# Patient Record
Sex: Female | Born: 1965 | Race: Black or African American | Hispanic: No | Marital: Single | Smoking: Current every day smoker
Health system: Southern US, Community
[De-identification: ages and names within clinical notes are randomized; demographics above are authoritative.]

## PROBLEM LIST (undated history)

## (undated) DIAGNOSIS — R112 Nausea with vomiting, unspecified: Secondary | ICD-10-CM

## (undated) DIAGNOSIS — E669 Obesity, unspecified: Secondary | ICD-10-CM

## (undated) DIAGNOSIS — J40 Bronchitis, not specified as acute or chronic: Secondary | ICD-10-CM

## (undated) DIAGNOSIS — Z9889 Other specified postprocedural states: Secondary | ICD-10-CM

## (undated) DIAGNOSIS — E119 Type 2 diabetes mellitus without complications: Secondary | ICD-10-CM

## (undated) DIAGNOSIS — I10 Essential (primary) hypertension: Secondary | ICD-10-CM

## (undated) DIAGNOSIS — M545 Low back pain, unspecified: Secondary | ICD-10-CM

## (undated) DIAGNOSIS — O2230 Deep phlebothrombosis in pregnancy, unspecified trimester: Secondary | ICD-10-CM

## (undated) DIAGNOSIS — G8929 Other chronic pain: Secondary | ICD-10-CM

## (undated) DIAGNOSIS — D571 Sickle-cell disease without crisis: Secondary | ICD-10-CM

## (undated) DIAGNOSIS — M5126 Other intervertebral disc displacement, lumbar region: Secondary | ICD-10-CM

## (undated) HISTORY — DX: Other intervertebral disc displacement, lumbar region: M51.26

---

## 2005-05-04 DIAGNOSIS — O2230 Deep phlebothrombosis in pregnancy, unspecified trimester: Secondary | ICD-10-CM

## 2005-05-04 HISTORY — DX: Deep phlebothrombosis in pregnancy, unspecified trimester: O22.30

## 2013-01-31 ENCOUNTER — Emergency Department (HOSPITAL_COMMUNITY): Payer: Medicare Other

## 2013-01-31 ENCOUNTER — Emergency Department (HOSPITAL_COMMUNITY)
Admission: EM | Admit: 2013-01-31 | Discharge: 2013-01-31 | Disposition: A | Discharge status: Home / Self Care | Payer: Medicare Other | Attending: Emergency Medicine | Admitting: Emergency Medicine

## 2013-01-31 ENCOUNTER — Encounter (HOSPITAL_COMMUNITY): Payer: Self-pay | Admitting: *Deleted

## 2013-01-31 DIAGNOSIS — Z3202 Encounter for pregnancy test, result negative: Secondary | ICD-10-CM | POA: Insufficient documentation

## 2013-01-31 DIAGNOSIS — E876 Hypokalemia: Secondary | ICD-10-CM | POA: Insufficient documentation

## 2013-01-31 DIAGNOSIS — B9689 Other specified bacterial agents as the cause of diseases classified elsewhere: Secondary | ICD-10-CM | POA: Insufficient documentation

## 2013-01-31 DIAGNOSIS — A599 Trichomoniasis, unspecified: Secondary | ICD-10-CM

## 2013-01-31 DIAGNOSIS — A499 Bacterial infection, unspecified: Secondary | ICD-10-CM | POA: Insufficient documentation

## 2013-01-31 DIAGNOSIS — Z862 Personal history of diseases of the blood and blood-forming organs and certain disorders involving the immune mechanism: Secondary | ICD-10-CM | POA: Insufficient documentation

## 2013-01-31 DIAGNOSIS — D259 Leiomyoma of uterus, unspecified: Secondary | ICD-10-CM

## 2013-01-31 DIAGNOSIS — Z8709 Personal history of other diseases of the respiratory system: Secondary | ICD-10-CM | POA: Insufficient documentation

## 2013-01-31 DIAGNOSIS — Z79899 Other long term (current) drug therapy: Secondary | ICD-10-CM | POA: Insufficient documentation

## 2013-01-31 DIAGNOSIS — N760 Acute vaginitis: Secondary | ICD-10-CM | POA: Insufficient documentation

## 2013-01-31 DIAGNOSIS — Z880 Allergy status to penicillin: Secondary | ICD-10-CM | POA: Insufficient documentation

## 2013-01-31 DIAGNOSIS — F17200 Nicotine dependence, unspecified, uncomplicated: Secondary | ICD-10-CM | POA: Insufficient documentation

## 2013-01-31 DIAGNOSIS — Z794 Long term (current) use of insulin: Secondary | ICD-10-CM | POA: Insufficient documentation

## 2013-01-31 DIAGNOSIS — E119 Type 2 diabetes mellitus without complications: Secondary | ICD-10-CM | POA: Insufficient documentation

## 2013-01-31 HISTORY — DX: Sickle-cell disease without crisis: D57.1

## 2013-01-31 HISTORY — DX: Bronchitis, not specified as acute or chronic: J40

## 2013-01-31 HISTORY — DX: Type 2 diabetes mellitus without complications: E11.9

## 2013-01-31 LAB — CBC WITH DIFFERENTIAL/PLATELET
Basophils Absolute: 0 10*3/uL (ref 0.0–0.1)
Eosinophils Relative: 1 % (ref 0–5)
Hemoglobin: 11.7 g/dL — ABNORMAL LOW (ref 12.0–15.0)
Lymphocytes Relative: 36 % (ref 12–46)
Lymphs Abs: 3.6 10*3/uL (ref 0.7–4.0)
MCV: 82.9 fL (ref 78.0–100.0)
Monocytes Relative: 7 % (ref 3–12)
Neutro Abs: 5.6 10*3/uL (ref 1.7–7.7)
Neutrophils Relative %: 56 % (ref 43–77)
Platelets: 260 10*3/uL (ref 150–400)
RBC: 4.38 MIL/uL (ref 3.87–5.11)
WBC: 10.1 10*3/uL (ref 4.0–10.5)

## 2013-01-31 LAB — BASIC METABOLIC PANEL
CO2: 26 mEq/L (ref 19–32)
Chloride: 104 mEq/L (ref 96–112)
Glucose, Bld: 159 mg/dL — ABNORMAL HIGH (ref 70–99)
Potassium: 3 mEq/L — ABNORMAL LOW (ref 3.5–5.1)
Sodium: 139 mEq/L (ref 135–145)

## 2013-01-31 LAB — URINALYSIS, ROUTINE W REFLEX MICROSCOPIC
Bilirubin Urine: NEGATIVE
Nitrite: NEGATIVE
Specific Gravity, Urine: 1.015 (ref 1.005–1.030)
pH: 6 (ref 5.0–8.0)

## 2013-01-31 LAB — URINE MICROSCOPIC-ADD ON

## 2013-01-31 LAB — WET PREP, GENITAL: Yeast Wet Prep HPF POC: NONE SEEN

## 2013-01-31 LAB — POCT PREGNANCY, URINE: Preg Test, Ur: NEGATIVE

## 2013-01-31 MED ORDER — METRONIDAZOLE 500 MG PO TABS: 500.0000 mg | ORAL_TABLET | Freq: Two times a day (BID) | ORAL | Status: DC

## 2013-01-31 MED ORDER — POTASSIUM CHLORIDE CRYS ER 20 MEQ PO TBCR
40.0000 meq | EXTENDED_RELEASE_TABLET | Freq: Once | ORAL | Status: AC
  Administered 2013-01-31: 40 meq via ORAL
  Filled 2013-01-31: qty 2

## 2013-01-31 MED ORDER — METRONIDAZOLE 500 MG PO TABS
2000.0000 mg | ORAL_TABLET | Freq: Once | ORAL | Status: AC
  Administered 2013-01-31: 2000 mg via ORAL
  Filled 2013-01-31: qty 4

## 2013-01-31 NOTE — ED Provider Notes (Signed)
CSN: [id number]     Arrival date & time 01/31/13  0408 History   First MD Initiated Contact with Patient 01/31/13 [phone]     Chief Complaint  Patient presents with  . Vaginal Bleeding   (Consider location/radiation/quality/duration/timing/severity/associated sxs/prior Treatment) HPI Comments: Patient is a 47 year old female who presents with a 1 month history of vaginal bleeding. Symptoms started gradually and progressively worsened since the onset. Patient reports a moderate amount of bleeding and is unsure how many tampons/pads she uses per day. Patient reports getting her period "like clockwork" monthly but this time she never stopped bleeding. Patient reports associated 2 week history of lower abdominal cramping. No aggravating/alleviating factors. No other associated symptoms.   Patient is a 47 y.o. female presenting with vaginal bleeding.  Vaginal Bleeding Associated symptoms: abdominal pain     Past Medical History  Diagnosis Date  . Diabetes mellitus without complication   . Sickle cell anemia   . Bronchitis    Past Surgical History  Procedure Laterality Date  . Cesarean section     History reviewed. No pertinent family history. History  Substance Use Topics  . Smoking status: Current Every Day Smoker  . Smokeless tobacco: Not on file  . Alcohol Use: No   OB History   Grav Para Term Preterm Abortions TAB SAB Ect Mult Living                 Review of Systems  Gastrointestinal: Positive for abdominal pain.  Genitourinary: Positive for vaginal bleeding.  All other systems reviewed and are negative.    Allergies  Penicillins  Home Medications   Current Outpatient Rx  Name  Route  Sig  Dispense  Refill  . insulin aspart (NOVOLOG) 100 UNIT/ML injection   Subcutaneous   Inject 2-6 Units into the skin 3 (three) times daily with meals. Sliding scale         . insulin detemir (LEVEMIR) 100 UNIT/ML injection   Subcutaneous   Inject 30 Units into the skin 2 (two)  times daily.         . Liraglutide (VICTOZA) 18 MG/3ML SOPN   Subcutaneous   Inject 18 mg into the skin every evening.          BP 136/72  Pulse 61  Temp(Src) 98 F (36.7 C) (Oral)  Resp 18  SpO2 98% Physical Exam  Nursing note and vitals reviewed. Constitutional: She is oriented to person, place, and time. She appears well-developed and well-nourished. No distress.  HENT:  Head: Normocephalic and atraumatic.  Eyes: Conjunctivae and EOM are normal.  Neck: Normal range of motion.  Cardiovascular: Normal rate and regular rhythm.  Exam reveals no gallop and no friction rub.   No murmur heard. Pulmonary/Chest: Effort normal and breath sounds normal. She has no wheezes. She has no rales. She exhibits no tenderness.  Abdominal: Soft. She exhibits no distension. There is tenderness. There is no rebound and no guarding.  Obese abdomen. Lower abdominal generalized tenderness to palpation. No peritoneal signs or focal tenderness to palpation.   Genitourinary: Vagina normal.  Moderate amount of blood noted in vagina. Cervical os closed. No CMT. No discharge noted. No tenderness to palpation or abnormal masses palpated on bimanual exam.   Musculoskeletal: Normal range of motion.  Neurological: She is alert and oriented to person, place, and time. Coordination normal.  Speech is goal-oriented. Moves limbs without ataxia.   Skin: Skin is warm and dry.  Psychiatric: She has a normal mood  and affect. Her behavior is normal.    ED Course  Procedures (including critical care time) Labs Review Labs Reviewed  WET PREP, GENITAL - Abnormal; Notable for the following:    Trich, Wet Prep FEW (*)    Clue Cells Wet Prep HPF POC MODERATE (*)    WBC, Wet Prep HPF POC FEW (*)    All other components within normal limits  URINALYSIS, ROUTINE W REFLEX MICROSCOPIC - Abnormal; Notable for the following:    APPearance CLOUDY (*)    Hgb urine dipstick TRACE (*)    All other components within normal  limits  CBC WITH DIFFERENTIAL - Abnormal; Notable for the following:    Hemoglobin 11.7 (*)    All other components within normal limits  BASIC METABOLIC PANEL - Abnormal; Notable for the following:    Potassium 3.0 (*)    Glucose, Bld 159 (*)    All other components within normal limits  URINE MICROSCOPIC-ADD ON - Abnormal; Notable for the following:    Squamous Epithelial / LPF FEW (*)    All other components within normal limits  GC/CHLAMYDIA PROBE AMP  POCT PREGNANCY, URINE   Imaging Review US Transvaginal Non-ob  01/31/2013   CLINICAL DATA:  Left lower quadrant pain  EXAM: TRANSABDOMINAL AND TRANSVAGINAL ULTRASOUND OF PELVIS  TECHNIQUE: Both transabdominal and transvaginal ultrasound examinations of the pelvis were performed. Transabdominal technique was performed for global imaging of the pelvis including uterus, ovaries, adnexal regions, and pelvic cul-de-sac. It was necessary to proceed with endovaginal exam following the transabdominal exam to visualize the ovaries.  COMPARISON:  None  FINDINGS: Uterus  Measurements: 8.2 x 4.3 x 5.0 cm. 1.6 x 1.4 cm partially calcified fibroid in the anterior urine body.  Endometrium  Thickness: 8.7 mm  No focal abnormality visualized.  Right ovary  Measurements: 3.1 x 1.9 x 1.8 cm. Normal appearance/no adnexal mass.  Left ovary  Measurements: 2.9 x 1.6 x 2.7 cm. Normal appearance/no adnexal mass.  Other findings  No free fluid.  IMPRESSION: Partially calcified uterine fibroid. Otherwise negative   Electronically Signed   By: Marlan Palau M.D.   On: 01/31/2013 09:03   US Pelvis Complete  01/31/2013   CLINICAL DATA:  Left lower quadrant pain  EXAM: TRANSABDOMINAL AND TRANSVAGINAL ULTRASOUND OF PELVIS  TECHNIQUE: Both transabdominal and transvaginal ultrasound examinations of the pelvis were performed. Transabdominal technique was performed for global imaging of the pelvis including uterus, ovaries, adnexal regions, and pelvic cul-de-sac. It was necessary  to proceed with endovaginal exam following the transabdominal exam to visualize the ovaries.  COMPARISON:  None  FINDINGS: Uterus  Measurements: 8.2 x 4.3 x 5.0 cm. 1.6 x 1.4 cm partially calcified fibroid in the anterior urine body.  Endometrium  Thickness: 8.7 mm  No focal abnormality visualized.  Right ovary  Measurements: 3.1 x 1.9 x 1.8 cm. Normal appearance/no adnexal mass.  Left ovary  Measurements: 2.9 x 1.6 x 2.7 cm. Normal appearance/no adnexal mass.  Other findings  No free fluid.  IMPRESSION: Partially calcified uterine fibroid. Otherwise negative   Electronically Signed   By: Marlan Palau M.D.   On: 01/31/2013 09:03    MDM   1. Uterine fibroid   2. BV (bacterial vaginosis)   3. Trichomonas   4. Hypokalemia     7:57 AM Patient's labs unremarkable for acute changes. Wet prep shows trichomonas and clue cells. Patient will have pelvic US here. Vitals stable and patient afebrile.   9:43 AM Patient will  be treated here for trichomonas. Patient will be discharged with Flagyl for BV. Patient will be referred to [facility] for uterine fibroids. Vitals stable and patient afebrile. Patient given PO potassium for repletion due to potassium of 3.0.     Emilia Beck, [place] 01/31/13 [phone]

## 2013-01-31 NOTE — ED Provider Notes (Signed)
Medical screening examination/treatment/procedure(s) were performed by non-physician practitioner and as supervising physician I was immediately available for consultation/collaboration.  Doug Sou, MD 01/31/13 [phone]

## 2013-01-31 NOTE — ED Notes (Signed)
Pt states vaginal bleeding for three days at a time. Pt state that the pain is severe each time the bleeding starts. Pt states bright red blood and dark blood.

## 2013-02-01 LAB — GC/CHLAMYDIA PROBE AMP
CT Probe RNA: NEGATIVE
GC Probe RNA: NEGATIVE

## 2013-02-15 ENCOUNTER — Emergency Department (HOSPITAL_COMMUNITY)
Admission: EM | Admit: 2013-02-15 | Discharge: 2013-02-15 | Disposition: A | Discharge status: Home / Self Care | Payer: Medicare Other | Attending: Emergency Medicine | Admitting: Emergency Medicine

## 2013-02-15 ENCOUNTER — Encounter (HOSPITAL_COMMUNITY): Payer: Self-pay | Admitting: Emergency Medicine

## 2013-02-15 DIAGNOSIS — Z8709 Personal history of other diseases of the respiratory system: Secondary | ICD-10-CM | POA: Insufficient documentation

## 2013-02-15 DIAGNOSIS — Z880 Allergy status to penicillin: Secondary | ICD-10-CM | POA: Insufficient documentation

## 2013-02-15 DIAGNOSIS — F17200 Nicotine dependence, unspecified, uncomplicated: Secondary | ICD-10-CM | POA: Insufficient documentation

## 2013-02-15 DIAGNOSIS — Z794 Long term (current) use of insulin: Secondary | ICD-10-CM | POA: Insufficient documentation

## 2013-02-15 DIAGNOSIS — E119 Type 2 diabetes mellitus without complications: Secondary | ICD-10-CM | POA: Insufficient documentation

## 2013-02-15 DIAGNOSIS — Z862 Personal history of diseases of the blood and blood-forming organs and certain disorders involving the immune mechanism: Secondary | ICD-10-CM | POA: Insufficient documentation

## 2013-02-15 DIAGNOSIS — Z792 Long term (current) use of antibiotics: Secondary | ICD-10-CM | POA: Insufficient documentation

## 2013-02-15 DIAGNOSIS — Z79899 Other long term (current) drug therapy: Secondary | ICD-10-CM | POA: Insufficient documentation

## 2013-02-15 DIAGNOSIS — T7840XA Allergy, unspecified, initial encounter: Secondary | ICD-10-CM

## 2013-02-15 DIAGNOSIS — R21 Rash and other nonspecific skin eruption: Secondary | ICD-10-CM | POA: Insufficient documentation

## 2013-02-15 MED ORDER — EPINEPHRINE 0.3 MG/0.3ML IJ SOAJ: 0.3000 mg | INTRAMUSCULAR | Status: AC | PRN

## 2013-02-15 MED ORDER — FAMOTIDINE 20 MG PO TABS
20.0000 mg | ORAL_TABLET | Freq: Once | ORAL | Status: AC
  Administered 2013-02-15: 20 mg via ORAL
  Filled 2013-02-15: qty 1

## 2013-02-15 MED ORDER — DIPHENHYDRAMINE HCL 25 MG PO CAPS: 25.0000 mg | ORAL_CAPSULE | Freq: Four times a day (QID) | ORAL | Status: AC | PRN

## 2013-02-15 MED ORDER — METHYLPREDNISOLONE SODIUM SUCC 125 MG IJ SOLR
125.0000 mg | Freq: Once | INTRAMUSCULAR | Status: AC
  Administered 2013-02-15: 125 mg via INTRAVENOUS
  Filled 2013-02-15: qty 2

## 2013-02-15 MED ORDER — DIPHENHYDRAMINE HCL 50 MG/ML IJ SOLN
25.0000 mg | Freq: Once | INTRAMUSCULAR | Status: AC
  Administered 2013-02-15: 25 mg via INTRAVENOUS
  Filled 2013-02-15: qty 1

## 2013-02-15 MED ORDER — PREDNISONE 20 MG PO TABS: 60.0000 mg | ORAL_TABLET | Freq: Every day | ORAL | Status: DC

## 2013-02-15 MED ORDER — FAMOTIDINE 20 MG PO TABS: 20.0000 mg | ORAL_TABLET | Freq: Two times a day (BID) | ORAL | Status: DC

## 2013-02-15 NOTE — ED Provider Notes (Signed)
CSN: [id number]     Arrival date & time 02/15/13  0356 History   First MD Initiated Contact with Patient 02/15/13 [phone]     Chief Complaint  Patient presents with  . Allergic Reaction   (Consider location/radiation/quality/duration/timing/severity/associated sxs/prior Treatment) HPI History provided by patient. Itchy rash starting yesterday morning and has been ongoing throughout the day. Her throat feels scratchy she denies any wheezing or difficulty breathing no tongue or lip or throat swelling otherwise. She has migrating rash that comes and goes "all over".  No new medications. No known new food exposures. No new soaps or detergents. No known precipitating factors. No history of allergic reaction the past. Symptoms moderate in severity. She does have a penicillin allergy but denies any recent antibiotics. She has no known food allergies.  Past Medical History  Diagnosis Date  . Diabetes mellitus without complication   . Sickle cell anemia   . Bronchitis    Past Surgical History  Procedure Laterality Date  . Cesarean section     No family history on file. History  Substance Use Topics  . Smoking status: Current Every Day Smoker  . Smokeless tobacco: Not on file  . Alcohol Use: No   OB History   Grav Para Term Preterm Abortions TAB SAB Ect Mult Living                 Review of Systems  Constitutional: Negative for fever and chills.  Eyes: Negative for visual disturbance.  Respiratory: Negative for wheezing and stridor.   Cardiovascular: Negative for chest pain.  Gastrointestinal: Negative for vomiting and abdominal pain.  Genitourinary: Negative for flank pain.  Musculoskeletal: Negative for back pain, neck pain and neck stiffness.  Skin: Positive for rash.  Neurological: Negative for headaches.  All other systems reviewed and are negative.    Allergies  Penicillins and Bee venom  Home Medications   Current Outpatient Rx  Name  Route  Sig  Dispense  Refill  .  insulin aspart (NOVOLOG) 100 UNIT/ML injection   Subcutaneous   Inject 2-6 Units into the skin 3 (three) times daily with meals. Sliding scale         . insulin detemir (LEVEMIR) 100 UNIT/ML injection   Subcutaneous   Inject 30 Units into the skin 2 (two) times daily.         . Liraglutide (VICTOZA) 18 MG/3ML SOPN   Subcutaneous   Inject 18 mg into the skin every evening.         . metroNIDAZOLE (FLAGYL) 500 MG tablet   Oral   Take 1 tablet (500 mg total) by mouth 2 (two) times daily.   14 tablet   0    BP 144/78  Pulse 89  Temp(Src) 98 F (36.7 C) (Oral)  Resp 20  SpO2 98% Physical Exam  Nursing note and vitals reviewed. Constitutional: She is oriented to person, place, and time. She appears well-developed and well-nourished.  HENT:  Head: Normocephalic and atraumatic.  Mouth/Throat: Oropharynx is clear and moist. No oropharyngeal exudate.  No intraoral involvement. Uvula midline. No tongue or lip swelling.  Eyes: EOM are normal. Pupils are equal, round, and reactive to light.  Neck: Neck supple. No thyromegaly present.  Cardiovascular: Normal rate, normal heart sounds and intact distal pulses.   Pulmonary/Chest: Effort normal and breath sounds normal. No stridor. No respiratory distress. She exhibits no tenderness.  Abdominal: Soft. There is no tenderness.  Musculoskeletal: Normal range of motion. She exhibits no edema.  Lymphadenopathy:    She has no cervical adenopathy.  Neurological: She is alert and oriented to person, place, and time.  Skin: Skin is warm and dry.  Diffuse erythematous urticarial blanching rash consistent with hives    ED Course  Procedures (including critical care time) Labs Review Labs Reviewed - No data to display Imaging Review No results found.  EKG Interpretation   None      IV Benadryl, Solu-Medrol. Pepcid Provided  6:14 AM symptomatically improved and requesting to be discharged home.  Itching resolved. Lungs clear  without stridor. Plan discharge home with prescriptions for prednisone, Benadryl, Pepcid. Outpatient referrals provided. Return precautions verbalized as understood MDM  Diagnosis: Allergic reaction.  Improved with medications Vital signs and nurses notes reviewed   Sunnie Nielsen, MD 02/15/13 [phone]

## 2013-02-15 NOTE — ED Notes (Addendum)
MD updated on pt's condition.

## 2013-02-15 NOTE — ED Notes (Addendum)
PT states she first noticed her hive yesterday morning, almost 24 hours ago. Pt states she has hives all over her body that have come and went throughout the 24 hours, pt states the hives are very itchy and painful. Pt states she is SOB. Pt states she feels like she has hives in throat. Pt states she has an allergy to penicillin and bee stings. No facial swelling or lip edema noted at this time. Pt is A&O X4. Pt states she has taken benadryl around the clock the past 24 hours, pt states the hives have gone away briefly but have come back worse every time.

## 2013-02-15 NOTE — ED Notes (Signed)
MD at bedside. 

## 2013-03-13 ENCOUNTER — Encounter: Payer: Self-pay | Admitting: Obstetrics & Gynecology

## 2013-03-13 ENCOUNTER — Other Ambulatory Visit (HOSPITAL_COMMUNITY)
Admission: RE | Admit: 2013-03-13 | Discharge: 2013-03-13 | Disposition: A | Discharge status: Home / Self Care | Payer: Medicare Other | Source: Ambulatory Visit | Attending: Obstetrics & Gynecology | Admitting: Obstetrics & Gynecology

## 2013-03-13 ENCOUNTER — Encounter: Payer: Self-pay | Admitting: *Deleted

## 2013-03-13 ENCOUNTER — Ambulatory Visit (INDEPENDENT_AMBULATORY_CARE_PROVIDER_SITE_OTHER): Payer: Medicare Other | Admitting: Obstetrics & Gynecology

## 2013-03-13 VITALS — BP 129/72 | HR 85 | Temp 98.9°F | Ht 69.0 in | Wt 295.5 lb

## 2013-03-13 DIAGNOSIS — N920 Excessive and frequent menstruation with regular cycle: Secondary | ICD-10-CM | POA: Insufficient documentation

## 2013-03-13 DIAGNOSIS — D259 Leiomyoma of uterus, unspecified: Secondary | ICD-10-CM

## 2013-03-13 DIAGNOSIS — Z01812 Encounter for preprocedural laboratory examination: Secondary | ICD-10-CM

## 2013-03-13 LAB — POCT PREGNANCY, URINE: Preg Test, Ur: NEGATIVE

## 2013-03-13 NOTE — Progress Notes (Signed)
Patient ID: Anita Morgan, female   DOB: May 13, 1965, 47 y.o.   MRN: [id number]  Chief Complaint  Patient presents with  . Fibroids  . Menorrhagia    HPI Anita Morgan is a 47 y.o. female.  [id number] Patient's last menstrual period was 03/06/2013. Regular menses  Until early September when irregular sometime heavy bleeding started with cramps. Was tx for BV and trichomomas.Marland Kitchen HPI  Past Medical History  Diagnosis Date  . Diabetes mellitus without complication   . Sickle cell anemia   . Bronchitis   . Ruptured lumbar disc     Past Surgical History  Procedure Laterality Date  . Cesarean section    X4  Family History  Problem Relation Age of Onset  . Diabetes Mother     Social History History  Substance Use Topics  . Smoking status: Current Every Day Smoker -- 0.75 packs/day    Types: Cigarettes  . Smokeless tobacco: Never Used  . Alcohol Use: No    Allergies  Allergen Reactions  . Penicillins Anaphylaxis  . Bee Venom     Current Outpatient Prescriptions  Medication Sig Dispense Refill  . diphenhydrAMINE (BENADRYL) 25 mg capsule Take 1 capsule (25 mg total) by mouth every 6 (six) hours as needed for itching.  30 capsule  0  . Liraglutide (VICTOZA) 18 MG/3ML SOPN Inject 18 mg into the skin every evening.      Marland Kitchen EPINEPHrine (EPIPEN) 0.3 mg/0.3 mL SOAJ injection Inject 0.3 mLs (0.3 mg total) into the muscle as needed.  1 Device  1  . insulin aspart (NOVOLOG) 100 UNIT/ML injection Inject 2-6 Units into the skin 3 (three) times daily with meals. Sliding scale      . insulin detemir (LEVEMIR) 100 UNIT/ML injection Inject 30 Units into the skin 2 (two) times daily.       No current facility-administered medications for this visit.    Review of Systems Review of Systems  Constitutional: Negative for fever.  Genitourinary: Positive for menstrual problem and pelvic pain. Negative for vaginal bleeding and vaginal pain.    Blood pressure 129/72, pulse 85, temperature 98.9 F (37.2  C), height 5\' 9"  (1.753 m), weight 295 lb 8 oz (134.038 kg), last menstrual period 03/06/2013.  Physical Exam Physical Exam  Constitutional: She is oriented to person, place, and time. She appears well-developed. No distress.  Abdominal: Soft. She exhibits no mass.  Genitourinary: Vagina normal and uterus normal. No vaginal discharge found.  No mass. States pap in [place] 1 year ago normal  Neurological: She is alert and oriented to person, place, and time.  Skin: Skin is warm and dry.  Psychiatric: She has a normal mood and affect. Her behavior is normal.    Data Reviewed ED note and labs CBC    Component Value Date/Time   WBC 10.1 01/31/2013 0730   RBC 4.38 01/31/2013 0730   HGB 11.7* 01/31/2013 0730   HCT 36.3 01/31/2013 0730   PLT 260 01/31/2013 0730   MCV 82.9 01/31/2013 0730   MCH 26.7 01/31/2013 0730   MCHC 32.2 01/31/2013 0730   RDW 14.1 01/31/2013 0730   LYMPHSABS 3.6 01/31/2013 0730   MONOABS 0.7 01/31/2013 0730   EOSABS 0.1 01/31/2013 0730   BASOSABS 0.0 01/31/2013 0730    CLINICAL DATA: Left lower quadrant pain  EXAM:  TRANSABDOMINAL AND TRANSVAGINAL ULTRASOUND OF PELVIS  TECHNIQUE:  Both transabdominal and transvaginal ultrasound examinations of the  pelvis were performed. Transabdominal technique was performed for  global imaging of  the pelvis including uterus, ovaries, adnexal  regions, and pelvic cul-de-sac. It was necessary to proceed with  endovaginal exam following the transabdominal exam to visualize the  ovaries.  COMPARISON: None  FINDINGS:  Uterus  Measurements: 8.2 x 4.3 x 5.0 cm. 1.6 x 1.4 cm partially calcified  fibroid in the anterior urine body.  Endometrium  Thickness: 8.7 mm No focal abnormality visualized.  Right ovary  Measurements: 3.1 x 1.9 x 1.8 cm. Normal appearance/no adnexal mass.  Left ovary  Measurements: 2.9 x 1.6 x 2.7 cm. Normal appearance/no adnexal mass.  Other findings  No free fluid.  IMPRESSION:  Partially calcified uterine  fibroid. Otherwise negative  Patient given informed consent, signed copy in the chart, time out was performed. Appropriate time out taken. . The patient was placed in the lithotomy position and the cervix brought into view with sterile speculum.  Portio of cervix cleansed x 2 with betadine swabs.  A tenaculum was placed in the anterior lip of the cervix.  The uterus was sounded for depth of 10 cm. A pipelle was introduced to into the uterus, suction created,  and an endometrial sample was obtained. All equipment was removed and accounted for.  The patient tolerated the procedure well.    Patient given post procedure instructions. The patient will return in 2 weeks for results.  Assessment    menometrorrhagia     Plan    RTC for Bx result. Candidate for Mirena        Toma Arts 03/13/2013, 3:46 PM

## 2013-03-13 NOTE — Progress Notes (Signed)
[facility] was referred here today for fibroids and irregular periods, states has 2 periods a month and heavy.

## 2013-03-13 NOTE — Patient Instructions (Signed)
Levonorgestrel intrauterine device (IUD) What is this medicine? LEVONORGESTREL IUD (LEE voe nor jes trel) is a contraceptive (birth control) device. The device is placed inside the uterus by a healthcare professional. It is used to prevent pregnancy and can also be used to treat heavy bleeding that occurs during your period. Depending on the device, it can be used for 3 to 5 years. This medicine may be used for other purposes; ask your health care provider or pharmacist if you have questions. COMMON BRAND NAME(S): Mirena, Skyla What should I tell my health care provider before I take this medicine? They need to know if you have any of these conditions: -abnormal Pap smear -cancer of the breast, uterus, or cervix -diabetes -endometritis -genital or pelvic infection now or in the past -have more than one sexual partner or your partner has more than one partner -heart disease -history of an ectopic or tubal pregnancy -immune system problems -IUD in place -liver disease or tumor -problems with blood clots or take blood-thinners -use intravenous drugs -uterus of unusual shape -vaginal bleeding that has not been explained -an unusual or allergic reaction to levonorgestrel, other hormones, silicone, or polyethylene, medicines, foods, dyes, or preservatives -pregnant or trying to get pregnant -breast-feeding How should I use this medicine? This device is placed inside the uterus by a health care professional. Talk to your pediatrician regarding the use of this medicine in children. Special care may be needed. Overdosage: If you think you have taken too much of this medicine contact a poison control center or emergency room at once. NOTE: This medicine is only for you. Do not share this medicine with others. What if I miss a dose? This does not apply. What may interact with this medicine? Do not take this medicine with any of the following  medications: -amprenavir -bosentan -fosamprenavir This medicine may also interact with the following medications: -aprepitant -barbiturate medicines for inducing sleep or treating seizures -bexarotene -griseofulvin -medicines to treat seizures like carbamazepine, ethotoin, felbamate, oxcarbazepine, phenytoin, topiramate -modafinil -pioglitazone -rifabutin -rifampin -rifapentine -some medicines to treat HIV infection like atazanavir, indinavir, lopinavir, nelfinavir, tipranavir, ritonavir -St. John's wort -warfarin This list may not describe all possible interactions. Give your health care provider a list of all the medicines, herbs, non-prescription drugs, or dietary supplements you use. Also tell them if you smoke, drink alcohol, or use illegal drugs. Some items may interact with your medicine. What should I watch for while using this medicine? Visit your doctor or health care professional for regular check ups. See your doctor if you or your partner has sexual contact with others, becomes HIV positive, or gets a sexual transmitted disease. This product does not protect you against HIV infection (AIDS) or other sexually transmitted diseases. You can check the placement of the IUD yourself by reaching up to the top of your vagina with clean fingers to feel the threads. Do not pull on the threads. It is a good habit to check placement after each menstrual period. Call your doctor right away if you feel more of the IUD than just the threads or if you cannot feel the threads at all. The IUD may come out by itself. You may become pregnant if the device comes out. If you notice that the IUD has come out use a backup birth control method like condoms and call your health care provider. Using tampons will not change the position of the IUD and are okay to use during your period. What side effects may I   notice from receiving this medicine? Side effects that you should report to your doctor or  health care professional as soon as possible: -allergic reactions like skin rash, itching or hives, swelling of the face, lips, or tongue -fever, flu-like symptoms -genital sores -high blood pressure -no menstrual period for 6 weeks during use -pain, swelling, warmth in the leg -pelvic pain or tenderness -severe or sudden headache -signs of pregnancy -stomach cramping -sudden shortness of breath -trouble with balance, talking, or walking -unusual vaginal bleeding, discharge -yellowing of the eyes or skin Side effects that usually do not require medical attention (report to your doctor or health care professional if they continue or are bothersome): -acne -breast pain -change in sex drive or performance -changes in weight -cramping, dizziness, or faintness while the device is being inserted -headache -irregular menstrual bleeding within first 3 to 6 months of use -nausea This list may not describe all possible side effects. Call your doctor for medical advice about side effects. You may report side effects to FDA at 1-800-FDA-1088. Where should I keep my medicine? This does not apply. NOTE: This sheet is a summary. It may not cover all possible information. If you have questions about this medicine, talk to your doctor, pharmacist, or health care provider.  2014, [employer]. (2011-05-21 13:54:04) Dysfunctional Uterine Bleeding Normally, menstrual periods begin between ages 42 to 41 in young women. A normal menstrual cycle/period may begin every 23 days up to 35 days and lasts from 1 to 7 days. Around 12 to 14 days before your menstrual period starts, ovulation (ovary produces an egg) occurs. When counting the time between menstrual periods, count from the first day of bleeding of the previous period to the first day of bleeding of the next period. Dysfunctional (abnormal) uterine bleeding is bleeding that is different from a normal menstrual period. Your periods may come  earlier or later than usual. They may be lighter, have blood clots or be heavier. You may have bleeding between periods, or you may skip one period or more. You may have bleeding after sexual intercourse, bleeding after menopause, or no menstrual period. CAUSES   Pregnancy (normal, miscarriage, tubal).  IUDs (intrauterine device, birth control).  Birth control pills.  Hormone treatment.  Menopause.  Infection of the cervix.  Blood clotting problems.  Infection of the inside lining of the uterus.  Endometriosis, inside lining of the uterus growing in the pelvis and other female organs.  Adhesions (scar tissue) inside the uterus.  Obesity or severe weight loss.  Uterine polyps inside the uterus.  Cancer of the vagina, cervix, or uterus.  Ovarian cysts or polycystic ovary syndrome.  Medical problems (diabetes, thyroid disease).  Uterine fibroids (noncancerous tumor).  Problems with your female hormones.  Endometrial hyperplasia, very thick lining and enlarged cells inside of the uterus.  Medicines that interfere with ovulation.  Radiation to the pelvis or abdomen.  Chemotherapy. DIAGNOSIS   Your doctor will discuss the history of your menstrual periods, medicines you are taking, changes in your weight, stress in your life, and any medical problems you may have.  Your doctor will do a physical and pelvic examination.  Your doctor may want to perform certain tests to make a diagnosis, such as:  Pap test.  Blood tests.  Cultures for infection.  CT scan.  Ultrasound.  Hysteroscopy.  Laparoscopy.  MRI.  Hysterosalpingography.  D and C.  Endometrial biopsy. TREATMENT  Treatment will depend on the cause of the dysfunctional uterine bleeding (DUB). Treatment  may include:  Observing your menstrual periods for a couple of months.  Prescribing medicines for medical problems, including:  Antibiotics.  Hormones.  Birth control pills.  Removing an  IUD (intrauterine device, birth control).  Surgery:  D and C (scrape and remove tissue from inside the uterus).  Laparoscopy (examine inside the abdomen with a lighted tube).  Uterine ablation (destroy lining of the uterus with electrical current, laser, heat, or freezing).  Hysteroscopy (examine cervix and uterus with a lighted tube).  Hysterectomy (remove the uterus). HOME CARE INSTRUCTIONS   If medicines were prescribed, take exactly as directed. Do not change or switch medicines without consulting your caregiver.  Long term heavy bleeding may result in iron deficiency. Your caregiver may have prescribed iron pills. They help replace the iron that your body lost from heavy bleeding. Take exactly as directed.  Do not take aspirin or medicines that contain aspirin one week before or during your menstrual period. Aspirin may make the bleeding worse.  If you need to change your sanitary pad or tampon more than once every 2 hours, stay in bed with your feet elevated and a cold pack on your lower abdomen. Rest as much as possible, until the bleeding stops or slows down.  Eat well-balanced meals. Eat foods high in iron. Examples are:  Leafy green vegetables.  Whole-grain breads and cereals.  Eggs.  Meat.  Liver.  Do not try to lose weight until the abnormal bleeding has stopped and your blood iron level is back to normal. Do not lift more than ten pounds or do strenuous activities when you are bleeding.  For a couple of months, make note on your calendar, marking the start and ending of your period, and the type of bleeding (light, medium, heavy, spotting, clots or missed periods). This is for your caregiver to better evaluate your problem. SEEK MEDICAL CARE IF:   You develop nausea (feeling sick to your stomach) and vomiting, dizziness, or diarrhea while you are taking your medicine.  You are getting lightheaded or weak.  You have any problems that may be related to the  medicine you are taking.  You develop pain with your DUB.  You want to remove your IUD.  You want to stop or change your birth control pills or hormones.  You have any type of abnormal bleeding mentioned above.  You are over 79 years old and have not had a menstrual period yet.  You are 47 years old and you are still having menstrual periods.  You have any of the symptoms mentioned above.  You develop a rash. SEEK IMMEDIATE MEDICAL CARE IF:   An oral temperature above 102 F (38.9 C) develops.  You develop chills.  You are changing your sanitary pad or tampon more than once an hour.  You develop abdominal pain.  You pass out or faint. Document Released: 04/17/2000 Document Revised: 07/13/2011 Document Reviewed: 03/19/2009 [facility] Patient Information 2014 [place], [place].

## 2013-04-20 ENCOUNTER — Ambulatory Visit: Payer: Self-pay | Admitting: Obstetrics & Gynecology

## 2013-05-15 ENCOUNTER — Emergency Department (HOSPITAL_COMMUNITY): Payer: Medicaid Other

## 2013-05-15 ENCOUNTER — Encounter (HOSPITAL_COMMUNITY): Payer: Self-pay | Admitting: Emergency Medicine

## 2013-05-15 ENCOUNTER — Emergency Department (HOSPITAL_COMMUNITY)
Admission: EM | Admit: 2013-05-15 | Discharge: 2013-05-15 | Disposition: A | Discharge status: Home / Self Care | Payer: Medicaid Other | Attending: Emergency Medicine | Admitting: Emergency Medicine

## 2013-05-15 DIAGNOSIS — Z862 Personal history of diseases of the blood and blood-forming organs and certain disorders involving the immune mechanism: Secondary | ICD-10-CM | POA: Insufficient documentation

## 2013-05-15 DIAGNOSIS — Z794 Long term (current) use of insulin: Secondary | ICD-10-CM | POA: Insufficient documentation

## 2013-05-15 DIAGNOSIS — Z880 Allergy status to penicillin: Secondary | ICD-10-CM | POA: Insufficient documentation

## 2013-05-15 DIAGNOSIS — E119 Type 2 diabetes mellitus without complications: Secondary | ICD-10-CM | POA: Insufficient documentation

## 2013-05-15 DIAGNOSIS — R112 Nausea with vomiting, unspecified: Secondary | ICD-10-CM | POA: Insufficient documentation

## 2013-05-15 DIAGNOSIS — F17200 Nicotine dependence, unspecified, uncomplicated: Secondary | ICD-10-CM | POA: Insufficient documentation

## 2013-05-15 DIAGNOSIS — M545 Low back pain, unspecified: Secondary | ICD-10-CM | POA: Insufficient documentation

## 2013-05-15 DIAGNOSIS — Z79899 Other long term (current) drug therapy: Secondary | ICD-10-CM | POA: Insufficient documentation

## 2013-05-15 DIAGNOSIS — J189 Pneumonia, unspecified organism: Secondary | ICD-10-CM

## 2013-05-15 DIAGNOSIS — J159 Unspecified bacterial pneumonia: Secondary | ICD-10-CM | POA: Insufficient documentation

## 2013-05-15 DIAGNOSIS — G8929 Other chronic pain: Secondary | ICD-10-CM | POA: Insufficient documentation

## 2013-05-15 DIAGNOSIS — M549 Dorsalgia, unspecified: Secondary | ICD-10-CM

## 2013-05-15 MED ORDER — KETOROLAC TROMETHAMINE 60 MG/2ML IM SOLN
60.0000 mg | Freq: Once | INTRAMUSCULAR | Status: AC
  Administered 2013-05-15: 60 mg via INTRAMUSCULAR
  Filled 2013-05-15: qty 2

## 2013-05-15 MED ORDER — OXYCODONE-ACETAMINOPHEN 5-325 MG PO TABS
2.0000 | ORAL_TABLET | Freq: Once | ORAL | Status: AC
  Administered 2013-05-15: 2 via ORAL
  Filled 2013-05-15: qty 2

## 2013-05-15 MED ORDER — ONDANSETRON 4 MG PO TBDP
4.0000 mg | ORAL_TABLET | Freq: Once | ORAL | Status: AC
  Administered 2013-05-15: 4 mg via ORAL
  Filled 2013-05-15: qty 1

## 2013-05-15 MED ORDER — HYDROCODONE-ACETAMINOPHEN 5-325 MG PO TABS: 1.0000 | ORAL_TABLET | ORAL | Status: DC | PRN

## 2013-05-15 MED ORDER — ALBUTEROL SULFATE (2.5 MG/3ML) 0.083% IN NEBU
5.0000 mg | INHALATION_SOLUTION | Freq: Once | RESPIRATORY_TRACT | Status: AC
  Administered 2013-05-15: 5 mg via RESPIRATORY_TRACT
  Filled 2013-05-15: qty 6

## 2013-05-15 MED ORDER — IPRATROPIUM BROMIDE 0.02 % IN SOLN
0.5000 mg | Freq: Once | RESPIRATORY_TRACT | Status: AC
  Administered 2013-05-15: 0.5 mg via RESPIRATORY_TRACT
  Filled 2013-05-15: qty 2.5

## 2013-05-15 MED ORDER — ALBUTEROL SULFATE (2.5 MG/3ML) 0.083% IN NEBU: 2.5000 mg | INHALATION_SOLUTION | RESPIRATORY_TRACT | Status: DC | PRN

## 2013-05-15 MED ORDER — AZITHROMYCIN 250 MG PO TABS: ORAL_TABLET | ORAL | Status: DC

## 2013-05-15 NOTE — ED Notes (Signed)
Pt comfortable with d/c and f/u instructions. Prescriptions x3 

## 2013-05-15 NOTE — Discharge Instructions (Signed)
Take antibiotic to completion. Take Vicodin for severe pain only. No driving or operating heavy machinery while taking vicodin. This medication may cause drowsiness. Followup with your primary care doctor in 5 days.   Pneumonia, Adult Pneumonia is an infection of the lungs.  CAUSES Pneumonia may be caused by bacteria or a virus. Usually, these infections are caused by breathing infectious particles into the lungs (respiratory tract). SYMPTOMS   Cough.  Fever.  Chest pain.  Increased rate of breathing.  Wheezing.  Mucus production. DIAGNOSIS  If you have the common symptoms of pneumonia, your caregiver will typically confirm the diagnosis with a chest X-ray. The X-ray will show an abnormality in the lung (pulmonary infiltrate) if you have pneumonia. Other tests of your blood, urine, or sputum may be done to find the specific cause of your pneumonia. Your caregiver may also do tests (blood gases or pulse oximetry) to see how well your lungs are working. TREATMENT  Some forms of pneumonia may be spread to other people when you cough or sneeze. You may be asked to wear a mask before and during your exam. Pneumonia that is caused by bacteria is treated with antibiotic medicine. Pneumonia that is caused by the influenza virus may be treated with an antiviral medicine. Most other viral infections must run their course. These infections will not respond to antibiotics.  PREVENTION A pneumococcal shot (vaccine) is available to prevent a common bacterial cause of pneumonia. This is usually suggested for:  People over 25 years old.  Patients on chemotherapy.  People with chronic lung problems, such as bronchitis or emphysema.  People with immune system problems. If you are over 65 or have a high risk condition, you may receive the pneumococcal vaccine if you have not received it before. In some countries, a routine influenza vaccine is also recommended. This vaccine can help prevent some  cases of pneumonia.You may be offered the influenza vaccine as part of your care. If you smoke, it is time to quit. You may receive instructions on how to stop smoking. Your caregiver can provide medicines and counseling to help you quit. HOME CARE INSTRUCTIONS   Cough suppressants may be used if you are losing too much rest. However, coughing protects you by clearing your lungs. You should avoid using cough suppressants if you can.  Your caregiver may have prescribed medicine if he or she thinks your pneumonia is caused by a bacteria or influenza. Finish your medicine even if you start to feel better.  Your caregiver may also prescribe an expectorant. This loosens the mucus to be coughed up.  Only take over-the-counter or prescription medicines for pain, discomfort, or fever as directed by your caregiver.  Do not smoke. Smoking is a common cause of bronchitis and can contribute to pneumonia. If you are a smoker and continue to smoke, your cough may last several weeks after your pneumonia has cleared.  A cold steam vaporizer or humidifier in your room or home may help loosen mucus.  Coughing is often worse at night. Sleeping in a semi-upright position in a recliner or using a couple pillows under your head will help with this.  Get rest as you feel it is needed. Your body will usually let you know when you need to rest. SEEK IMMEDIATE MEDICAL CARE IF:   Your illness becomes worse. This is especially true if you are elderly or weakened from any other disease.  You cannot control your cough with suppressants and are losing sleep.  You begin coughing up blood.  You develop pain which is getting worse or is uncontrolled with medicines.  You have a fever.  Any of the symptoms which initially brought you in for treatment are getting worse rather than better.  You develop shortness of breath or chest pain. MAKE SURE YOU:   Understand these instructions.  Will watch your  condition.  Will get help right away if you are not doing well or get worse. Document Released: 04/20/2005 Document Revised: 07/13/2011 Document Reviewed: 07/10/2010 [facility] Patient Information 2014 [place], [place].  Back Pain, Adult Low back pain is very common. About 1 in 5 people have back pain.The cause of low back pain is rarely dangerous. The pain often gets better over time.About half of people with a sudden onset of back pain feel better in just 2 weeks. About 8 in 10 people feel better by 6 weeks.  CAUSES Some common causes of back pain include:  Strain of the muscles or ligaments supporting the spine.  Wear and tear (degeneration) of the spinal discs.  Arthritis.  Direct injury to the back. DIAGNOSIS Most of the time, the direct cause of low back pain is not known.However, back pain can be treated effectively even when the exact cause of the pain is unknown.Answering your caregiver's questions about your overall health and symptoms is one of the most accurate ways to make sure the cause of your pain is not dangerous. If your caregiver needs more information, he or she may order lab work or imaging tests (X-rays or MRIs).However, even if imaging tests show changes in your back, this usually does not require surgery. HOME CARE INSTRUCTIONS For many people, back pain returns.Since low back pain is rarely dangerous, it is often a condition that people can learn to [facility] their own.   Remain active. It is stressful on the back to sit or stand in one place. Do not sit, drive, or stand in one place for more than 30 minutes at a time. Take short walks on level surfaces as soon as pain allows.Try to increase the length of time you walk each day.  Do not stay in bed.Resting more than 1 or 2 days can delay your recovery.  Do not avoid exercise or work.Your body is made to move.It is not dangerous to be active, even though your back may hurt.Your back will likely heal faster  if you return to being active before your pain is gone.  Pay attention to your body when you bend and lift. Many people have less discomfortwhen lifting if they bend their knees, keep the load close to their bodies,and avoid twisting. Often, the most comfortable positions are those that put less stress on your recovering back.  Find a comfortable position to sleep. Use a firm mattress and lie on your side with your knees slightly bent. If you lie on your back, put a pillow under your knees.  Only take over-the-counter or prescription medicines as directed by your caregiver. Over-the-counter medicines to reduce pain and inflammation are often the most helpful.Your caregiver may prescribe muscle relaxant drugs.These medicines help dull your pain so you can more quickly return to your normal activities and healthy exercise.  Put ice on the injured area.  Put ice in a plastic bag.  Place a towel between your skin and the bag.  Leave the ice on for 15-20 minutes, 03-04 times a day for the first 2 to 3 days. After that, ice and heat may be alternated to  reduce pain and spasms.  Ask your caregiver about trying back exercises and gentle massage. This may be of some benefit.  Avoid feeling anxious or stressed.Stress increases muscle tension and can worsen back pain.It is important to recognize when you are anxious or stressed and learn ways to manage it.Exercise is a great option. SEEK MEDICAL CARE IF:  You have pain that is not relieved with rest or medicine.  You have pain that does not improve in 1 week.  You have new symptoms.  You are generally not feeling well. SEEK IMMEDIATE MEDICAL CARE IF:   You have pain that radiates from your back into your legs.  You develop new bowel or bladder control problems.  You have unusual weakness or numbness in your arms or legs.  You develop nausea or vomiting.  You develop abdominal pain.  You feel faint. Document Released: 04/20/2005  Document Revised: 10/20/2011 Document Reviewed: 09/08/2010 [facility] Patient Information 2014 [place], [place].

## 2013-05-15 NOTE — ED Provider Notes (Signed)
CSN: [id number]     Arrival date & time 05/15/13  1036 History   First MD Initiated Contact with Patient 05/15/13 1132    This chart was scribed for Lorenda Peck, a non-physician practitioner working with Jasper Riling. Alvino Chapel, MD by Denice Bors, ED Scribe. This patient was seen in room TR08C/TR08C and the patient's care was started at 11:34 AM     Chief Complaint  Patient presents with  . Asthma  . Back Pain   (Consider location/radiation/quality/duration/timing/severity/associated sxs/prior Treatment) The history is provided by the patient. No language interpreter was used.   HPI Comments: Anita Morgan is a 48 y.o. female who presents to the Emergency Department with PMHx of bronchitis, chronic low back pain, and ruptured disc in low back complaining of constant worsening wheezing onset 5-7 days. Reports associated mild subjective fever, congestion, chest tightness, emesis, back pain, nausea, and productive cough with sputum. Denies any aggravating or alleviating factors. Denies associated chest pain.   Additionally reports chronic low back pain with no exacerbation or precipitating factors. Denies recent injury, recent trauma, urinary or fecal incontinence, urinary retention, perineal/saddle paresthesias, fever, PMHx of cancer, or IV drug use. She used to have fentanyl patches which she stopped using.   Past Medical History  Diagnosis Date  . Diabetes mellitus without complication   . Sickle cell anemia   . Bronchitis   . Ruptured lumbar disc    Past Surgical History  Procedure Laterality Date  . Cesarean section     Family History  Problem Relation Age of Onset  . Diabetes Mother    History  Substance Use Topics  . Smoking status: Current Every Day Smoker -- 0.75 packs/day    Types: Cigarettes  . Smokeless tobacco: Never Used  . Alcohol Use: No   OB History   Grav Para Term Preterm Abortions TAB SAB Ect Mult Living   5 4 3 1 1  1         Review of Systems   Constitutional: Positive for fever.  Respiratory: Positive for cough and shortness of breath.   Gastrointestinal: Positive for nausea and vomiting.  Musculoskeletal: Positive for back pain.  Neurological: Negative for numbness.  Psychiatric/Behavioral: Negative for confusion.   A complete 10 system review of systems was obtained and all systems are negative except as noted in the HPI and PMHx.     Allergies  Penicillins and Bee venom  Home Medications   Current Outpatient Rx  Name  Route  Sig  Dispense  Refill  . diphenhydrAMINE (BENADRYL) 25 mg capsule   Oral   Take 1 capsule (25 mg total) by mouth every 6 (six) hours as needed for itching.   30 capsule   0   . EPINEPHrine (EPIPEN) 0.3 mg/0.3 mL SOAJ injection   Intramuscular   Inject 0.3 mLs (0.3 mg total) into the muscle as needed.   1 Device   1   . insulin aspart (NOVOLOG) 100 UNIT/ML injection   Subcutaneous   Inject 2-6 Units into the skin 3 (three) times daily with meals. Sliding scale         . insulin detemir (LEVEMIR) 100 UNIT/ML injection   Subcutaneous   Inject 30 Units into the skin 2 (two) times daily.         . Liraglutide (VICTOZA) 18 MG/3ML SOPN   Subcutaneous   Inject 18 mg into the skin every evening.          BP 163/91  Pulse 83  Temp(Src) 98.7 F (37.1 C) (Oral)  Resp 20  SpO2 100% Physical Exam  Nursing note and vitals reviewed. Constitutional: She is oriented to person, place, and time. She appears well-developed and well-nourished. No distress.  HENT:  Head: Normocephalic and atraumatic.  Eyes: EOM are normal.  Neck: Neck supple. No tracheal deviation present.  Cardiovascular: Normal rate.   Pulmonary/Chest: Effort normal. No respiratory distress.  End expiratory wheezes (minimal) and inspiratory   Scattered rhonchi cleared with cough    Musculoskeletal: Normal range of motion.  No midline C-spine, T-spine, or L-spine tenderness with no step-offs or deformities noted    TTP of bilateral para-lumbar   Neurological: She is alert and oriented to person, place, and time.  Ambulates with normal gait   Skin: Skin is warm and dry.  Psychiatric: She has a normal mood and affect. Her behavior is normal.    ED Course  Procedures  COORDINATION OF CARE:  Nursing notes reviewed. Vital signs reviewed. Initial pt interview and examination performed.   11:39 AM-Discussed work up plan with pt at bedside, which includes CXR. Pt agrees with plan.   Treatment plan initiated: Medications  ketorolac (TORADOL) injection 60 mg (not administered)  albuterol (PROVENTIL) (2.5 MG/3ML) 0.083% nebulizer solution 5 mg (not administered)  ipratropium (ATROVENT) nebulizer solution 0.5 mg (not administered)     Initial diagnostic testing ordered.    Labs Review Labs Reviewed - No data to display Imaging Review Dg Chest 2 View  05/15/2013   CLINICAL DATA:  Asthma, back pain  EXAM: CHEST  2 VIEW  COMPARISON:  None.  FINDINGS: Borderline cardiomegaly. Diffuse central bronchitic changes and peribronchial cuffing. Ill-defined patchy opacity in the right lung base. No pleural effusion or pneumothorax. No acute osseous abnormality.  IMPRESSION: 1. Ill-defined patchy opacity in the right base on the frontal view may reflect early pneumonia. 2. Diffuse central airway thickening and peribronchial cuffing as can be seen an inflammatory conditions such as asthma, as well as an acute bronchitis and viral respiratory infection.   Electronically Signed   By: Jacqulynn Cadet M.D.   On: 05/15/2013 13:08    EKG Interpretation   None       MDM   1. CAP (community acquired pneumonia)   2. Chronic back pain    Patient presenting with wheezing, productive cough. She is well appearing and in no apparent distress, afebrile. Stable vital signs. Scattered wheezes and rhonchi heard on exam, chest x-ray showing ill-defined patchy opacity in the right base on the frontal view possibly  reflecting an early pneumonia. Given symptoms, will treat for pneumonia with azithromycin. Patient reports improvement of her symptoms after doing that. Repeat lung exam shows mild clinical improvement. No respiratory distress, O2 sat 100% on room air. Regarding low back pain, this is chronic. No red flags concerning patient's back pain. No s/s of central cord compression or cauda equina. Lower extremities are neurovascularly intact and patient is ambulating without difficulty. Advised her to see back specialist or discuss further with her PCP management for her symptoms. She has been unable to get into pain management. She is stable for discharge. Return precautions given. Patient states understanding of treatment care plan and is agreeable.   I personally performed the services described in this documentation, which was scribed in my presence. The recorded information has been reviewed and is accurate.    Illene Labrador, PA-C 05/15/13 1320

## 2013-05-15 NOTE — ED Notes (Signed)
Pt reports she current smoker with asthma has had chest tightness, cough and wheezing for the last week. Used her albtuerol inhaler with little relief. ALso reports chronic back pain for the last week. Pt awake, alert, orientedx4, VSS.

## 2013-05-15 NOTE — ED Provider Notes (Signed)
Medical screening examination/treatment/procedure(s) were performed by non-physician practitioner and as supervising physician I was immediately available for consultation/collaboration.  EKG Interpretation   None        Jasper Riling. Alvino Chapel, MD 05/15/13 [phone]

## 2013-05-18 ENCOUNTER — Emergency Department (INDEPENDENT_AMBULATORY_CARE_PROVIDER_SITE_OTHER)
Admission: EM | Admit: 2013-05-18 | Discharge: 2013-05-18 | Disposition: A | Discharge status: Home / Self Care | Payer: Medicare Other | Source: Home / Self Care | Attending: Family Medicine | Admitting: Family Medicine

## 2013-05-18 ENCOUNTER — Encounter (HOSPITAL_COMMUNITY): Payer: Self-pay | Admitting: Emergency Medicine

## 2013-05-18 DIAGNOSIS — G8929 Other chronic pain: Secondary | ICD-10-CM

## 2013-05-18 DIAGNOSIS — M549 Dorsalgia, unspecified: Secondary | ICD-10-CM

## 2013-05-18 MED ORDER — MELOXICAM 7.5 MG PO TABS: 7.5000 mg | ORAL_TABLET | Freq: Every day | ORAL | Status: DC

## 2013-05-18 NOTE — ED Notes (Signed)
Pt c/o chronic back pain.... Getting over the pneumonia and taking Levofloxacin Reports she has recently moved  From [place] She is alert; ambulated well to exam room w/NAD... No signs of acute distress.

## 2013-05-18 NOTE — ED Provider Notes (Signed)
CSN: [id number]     Arrival date & time 05/18/13  0800 History   First MD Initiated Contact with Patient 05/18/13 0815     Chief Complaint  Patient presents with  . Back Pain   (Consider location/radiation/quality/duration/timing/severity/associated sxs/prior Treatment) HPI Comments: Anita Morgan presents to the [facility] for assistance with her chronic lower back pain. She has struggled with chronic pain since 2007 and reports that she receives SSDI as a result of chronic back pain. States she and her PCP have been working to find either an orthopedic physician or pain management center that will assist her with long term management, but have not yet succeeded in locating provider that will accept her insurance. She is here because she would like [place] to help her establish care with a local orthopedist or pain management clinic. States pain is in her lower back and radiates to her right buttock and right thigh. Reports intermittent parasthesias at right foot. Denies saddle anesthesia or incontinence. No new injury or fever. No abdominal pain or urinary symptoms. She is morbidly obese.   Patient is a 48 y.o. female presenting with back pain. The history is provided by the patient.  Back Pain   Past Medical History  Diagnosis Date  . Diabetes mellitus without complication   . Sickle cell anemia   . Bronchitis   . Ruptured lumbar disc    Past Surgical History  Procedure Laterality Date  . Cesarean section     Family History  Problem Relation Age of Onset  . Diabetes Mother    History  Substance Use Topics  . Smoking status: Current Every Day Smoker -- 0.75 packs/day    Types: Cigarettes  . Smokeless tobacco: Never Used  . Alcohol Use: No   OB History   Grav Para Term Preterm Abortions TAB SAB Ect Mult Living   5 4 3 1 1  1         Review of Systems  Musculoskeletal: Positive for back pain.  All other systems reviewed and are negative.    Allergies  Bee venom and Penicillins  Home  Medications   Current Outpatient Rx  Name  Route  Sig  Dispense  Refill  . HYDROcodone-acetaminophen (NORCO/VICODIN) 5-325 MG per tablet   Oral   Take 1-2 tablets by mouth every 4 (four) hours as needed.   10 tablet   0   . insulin aspart (NOVOLOG) 100 UNIT/ML injection   Subcutaneous   Inject 2-6 Units into the skin 3 (three) times daily with meals. Sliding scale         . metFORMIN (GLUCOPHAGE) 500 MG tablet   Oral   Take 500 mg by mouth 2 (two) times daily with a meal.         . albuterol (PROVENTIL HFA;VENTOLIN HFA) 108 (90 BASE) MCG/ACT inhaler   Inhalation   Inhale 2 puffs into the lungs every 6 (six) hours as needed for wheezing or shortness of breath.         Marland Kitchen albuterol (PROVENTIL) (2.5 MG/3ML) 0.083% nebulizer solution   Nebulization   Take 2.5 mg by nebulization every 6 (six) hours as needed for wheezing or shortness of breath.         Marland Kitchen albuterol (PROVENTIL) (2.5 MG/3ML) 0.083% nebulizer solution   Nebulization   Take 3 mLs (2.5 mg total) by nebulization every 4 (four) hours as needed for wheezing or shortness of breath.   30 vial   0   . azithromycin (ZITHROMAX Z-PAK)  250 MG tablet      2 po day one, then 1 daily x 4 days   6 tablet   0   . diphenhydrAMINE (BENADRYL) 25 mg capsule   Oral   Take 1 capsule (25 mg total) by mouth every 6 (six) hours as needed for itching.   30 capsule   0   . EPINEPHrine (EPIPEN) 0.3 mg/0.3 mL SOAJ injection   Intramuscular   Inject 0.3 mLs (0.3 mg total) into the muscle as needed.   1 Device   1   . insulin detemir (LEVEMIR) 100 UNIT/ML injection   Subcutaneous   Inject 30-45 Units into the skin 2 (two) times daily. 45 units in the morning and 30 units in the evening.         . Liraglutide (VICTOZA) 18 MG/3ML SOPN   Subcutaneous   Inject 18 mg into the skin every evening.         Marland Kitchen oxyCODONE-acetaminophen (PERCOCET) 10-325 MG per tablet   Oral   Take 1 tablet by mouth every 4 (four) hours as needed  for pain.         Marland Kitchen Potassium (POTASSIMIN PO)   Oral   Take 1 tablet by mouth daily.          BP 135/83  Pulse 91  Temp(Src) 98.2 F (36.8 C) (Oral)  Resp 18  SpO2 100%  LMP 05/02/2013 Physical Exam  Nursing note and vitals reviewed. Constitutional: She is oriented to person, place, and time. She appears well-developed and well-nourished.  +appears uncomfortable  HENT:  Head: Normocephalic and atraumatic.  Eyes: Conjunctivae are normal. Right eye exhibits no discharge. Left eye exhibits no discharge. No scleral icterus.  Neck: Normal range of motion. Neck supple.  Cardiovascular: Normal rate, regular rhythm and normal heart sounds.   Pulmonary/Chest: Effort normal and breath sounds normal. No respiratory distress.  Abdominal: Soft. Bowel sounds are normal. She exhibits no distension. There is no tenderness.  Musculoskeletal:       Lumbar back: She exhibits decreased range of motion, tenderness and pain. She exhibits no bony tenderness, no swelling, no edema, no deformity, no laceration, no spasm and normal pulse.       Back:  +point tenderness at right SI joint and +SLR on right  Neurological: She is alert and oriented to person, place, and time.  Skin: Skin is warm and dry. No rash noted.  Psychiatric: She has a normal mood and affect. Her behavior is normal.    ED Course  Procedures (including critical care time) Labs Review Labs Reviewed - No data to display Imaging Review No results found.  EKG Interpretation    Date/Time:    Ventricular Rate:    PR Interval:    QRS Duration:   QT Interval:    QTC Calculation:   R Axis:     Text Interpretation:              MDM  I explained to patient that I sympathize with her condition, I was reassured by her exam and felt she did not have any findings to suggest the need for urgent or emergent intervention. I advised I would be happy to refer her to the orthopedist on call for [facility] for unassigned patients  in hope that she could establish an appointment. Will also provide her with pain management referral. I encouraged her to continue to work  with her PCP to find specialist.     Lahoma Rocker,  PA 05/18/13 [phone]

## 2013-05-18 NOTE — Discharge Instructions (Signed)
On you discharge instructions I have listed both the orthopedist on call for unassigned patient's today as well and the [facility] in hope that you can establish care with one or both of these groups. I have also provided an additional prescription that can be taken on a daily basis to try to help with your pain.

## 2013-05-19 ENCOUNTER — Emergency Department (HOSPITAL_COMMUNITY): Payer: Medicare Other

## 2013-05-19 ENCOUNTER — Inpatient Hospital Stay (HOSPITAL_COMMUNITY)
Admission: EM | Admit: 2013-05-19 | Discharge: 2013-05-21 | DRG: 194 | Disposition: A | Discharge status: Home / Self Care | Payer: Medicare Other | Attending: Internal Medicine | Admitting: Internal Medicine

## 2013-05-19 ENCOUNTER — Inpatient Hospital Stay (HOSPITAL_COMMUNITY): Payer: Medicare Other

## 2013-05-19 ENCOUNTER — Encounter (HOSPITAL_COMMUNITY): Payer: Self-pay | Admitting: Emergency Medicine

## 2013-05-19 DIAGNOSIS — Z79899 Other long term (current) drug therapy: Secondary | ICD-10-CM

## 2013-05-19 DIAGNOSIS — N920 Excessive and frequent menstruation with regular cycle: Secondary | ICD-10-CM

## 2013-05-19 DIAGNOSIS — Z880 Allergy status to penicillin: Secondary | ICD-10-CM

## 2013-05-19 DIAGNOSIS — G8929 Other chronic pain: Secondary | ICD-10-CM | POA: Diagnosis present

## 2013-05-19 DIAGNOSIS — R609 Edema, unspecified: Secondary | ICD-10-CM | POA: Diagnosis present

## 2013-05-19 DIAGNOSIS — E119 Type 2 diabetes mellitus without complications: Secondary | ICD-10-CM | POA: Diagnosis present

## 2013-05-19 DIAGNOSIS — Z6841 Body Mass Index (BMI) 40.0 and over, adult: Secondary | ICD-10-CM

## 2013-05-19 DIAGNOSIS — E876 Hypokalemia: Secondary | ICD-10-CM | POA: Diagnosis present

## 2013-05-19 DIAGNOSIS — M545 Low back pain, unspecified: Secondary | ICD-10-CM | POA: Diagnosis present

## 2013-05-19 DIAGNOSIS — J40 Bronchitis, not specified as acute or chronic: Secondary | ICD-10-CM | POA: Diagnosis present

## 2013-05-19 DIAGNOSIS — D571 Sickle-cell disease without crisis: Secondary | ICD-10-CM | POA: Diagnosis present

## 2013-05-19 DIAGNOSIS — Z833 Family history of diabetes mellitus: Secondary | ICD-10-CM

## 2013-05-19 DIAGNOSIS — M5126 Other intervertebral disc displacement, lumbar region: Secondary | ICD-10-CM | POA: Diagnosis present

## 2013-05-19 DIAGNOSIS — J189 Pneumonia, unspecified organism: Secondary | ICD-10-CM | POA: Diagnosis present

## 2013-05-19 DIAGNOSIS — F17200 Nicotine dependence, unspecified, uncomplicated: Secondary | ICD-10-CM | POA: Diagnosis present

## 2013-05-19 DIAGNOSIS — Z86718 Personal history of other venous thrombosis and embolism: Secondary | ICD-10-CM

## 2013-05-19 DIAGNOSIS — Z794 Long term (current) use of insulin: Secondary | ICD-10-CM

## 2013-05-19 HISTORY — DX: Nausea with vomiting, unspecified: Z98.890

## 2013-05-19 HISTORY — DX: Other specified postprocedural states: R11.2

## 2013-05-19 HISTORY — DX: Other specified postprocedural states: Z98.890

## 2013-05-19 HISTORY — DX: Deep phlebothrombosis in pregnancy, unspecified trimester: O22.30

## 2013-05-19 LAB — URINALYSIS W MICROSCOPIC + REFLEX CULTURE
Bilirubin Urine: NEGATIVE
GLUCOSE, UA: 500 mg/dL — AB
HGB URINE DIPSTICK: NEGATIVE
KETONES UR: NEGATIVE mg/dL
Leukocytes, UA: NEGATIVE
Nitrite: NEGATIVE
PROTEIN: NEGATIVE mg/dL
Specific Gravity, Urine: 1.026 (ref 1.005–1.030)
Urobilinogen, UA: 0.2 mg/dL (ref 0.0–1.0)
pH: 6 (ref 5.0–8.0)

## 2013-05-19 LAB — CREATININE, SERUM
Creatinine, Ser: 0.7 mg/dL (ref 0.50–1.10)
GFR calc Af Amer: >90 mL/min (ref >90)
GFR calc non Af Amer: >90 mL/min (ref >90)

## 2013-05-19 LAB — CBC WITH DIFFERENTIAL/PLATELET
BASOS PCT: 0 % (ref 0–1)
Basophils Absolute: 0 10*3/uL (ref 0.0–0.1)
EOS ABS: 0.1 10*3/uL (ref 0.0–0.7)
Eosinophils Relative: 1 % (ref 0–5)
HCT: 35.1 % — ABNORMAL LOW (ref 36.0–46.0)
Hemoglobin: 11.5 g/dL — ABNORMAL LOW (ref 12.0–15.0)
Lymphocytes Relative: 38 % (ref 12–46)
Lymphs Abs: 3.9 10*3/uL (ref 0.7–4.0)
MCH: 27.6 pg (ref 26.0–34.0)
MCHC: 32.8 g/dL (ref 30.0–36.0)
MCV: 84.4 fL (ref 78.0–100.0)
Monocytes Absolute: 0.6 10*3/uL (ref 0.1–1.0)
Monocytes Relative: 6 % (ref 3–12)
Neutro Abs: 5.7 10*3/uL (ref 1.7–7.7)
Neutrophils Relative %: 55 % (ref 43–77)
PLATELETS: 292 10*3/uL (ref 150–400)
RBC: 4.16 MIL/uL (ref 3.87–5.11)
RDW: 13.6 % (ref 11.5–15.5)
WBC: 10.4 10*3/uL (ref 4.0–10.5)

## 2013-05-19 LAB — COMPREHENSIVE METABOLIC PANEL
ALT: 9 U/L (ref 0–35)
AST: 12 U/L (ref 0–37)
Albumin: 3.2 g/dL — ABNORMAL LOW (ref 3.5–5.2)
Alkaline Phosphatase: 84 U/L (ref 39–117)
BUN: 9 mg/dL (ref 6–23)
CALCIUM: 8.9 mg/dL (ref 8.4–10.5)
CO2: 27 mEq/L (ref 19–32)
Chloride: 101 mEq/L (ref 96–112)
Creatinine, Ser: 0.62 mg/dL (ref 0.50–1.10)
GFR calc Af Amer: >90 mL/min (ref >90)
GFR calc non Af Amer: >90 mL/min (ref >90)
Glucose, Bld: 276 mg/dL — ABNORMAL HIGH (ref 70–99)
Potassium: 3.3 mEq/L — ABNORMAL LOW (ref 3.7–5.3)
SODIUM: 141 meq/L (ref 137–147)
TOTAL PROTEIN: 7 g/dL (ref 6.0–8.3)
Total Bilirubin: 0.4 mg/dL (ref 0.3–1.2)

## 2013-05-19 LAB — LIPASE, BLOOD: LIPASE: 12 U/L (ref 11–59)

## 2013-05-19 LAB — CBC
HCT: 34.5 % — ABNORMAL LOW (ref 36.0–46.0)
Hemoglobin: 10.8 g/dL — ABNORMAL LOW (ref 12.0–15.0)
MCH: 26.9 pg (ref 26.0–34.0)
MCHC: 31.3 g/dL (ref 30.0–36.0)
MCV: 85.8 fL (ref 78.0–100.0)
Platelets: 266 10*3/uL (ref 150–400)
RBC: 4.02 MIL/uL (ref 3.87–5.11)
RDW: 13.8 % (ref 11.5–15.5)
WBC: 10.4 10*3/uL (ref 4.0–10.5)

## 2013-05-19 LAB — RETICULOCYTES
RBC.: 4.16 MIL/uL (ref 3.87–5.11)
RETIC COUNT ABSOLUTE: 87.4 10*3/uL (ref 19.0–186.0)
Retic Ct Pct: 2.1 % (ref 0.4–3.1)

## 2013-05-19 LAB — PREGNANCY, URINE: PREG TEST UR: NEGATIVE

## 2013-05-19 MED ORDER — ONDANSETRON HCL 4 MG/2ML IJ SOLN
4.0000 mg | Freq: Once | INTRAMUSCULAR | Status: AC
  Administered 2013-05-19: 4 mg via INTRAVENOUS
  Filled 2013-05-19: qty 2

## 2013-05-19 MED ORDER — LIRAGLUTIDE 18 MG/3ML ~~LOC~~ SOPN: 18.0000 mg | PEN_INJECTOR | Freq: Every evening | SUBCUTANEOUS | Status: DC

## 2013-05-19 MED ORDER — INSULIN DETEMIR 100 UNIT/ML ~~LOC~~ SOLN
30.0000 [IU] | Freq: Every day | SUBCUTANEOUS | Status: DC
  Administered 2013-05-20: 30 [IU] via SUBCUTANEOUS
  Filled 2013-05-19 (×2): qty 0.3

## 2013-05-19 MED ORDER — GUAIFENESIN ER 600 MG PO TB12
600.0000 mg | ORAL_TABLET | Freq: Two times a day (BID) | ORAL | Status: DC | PRN
  Filled 2013-05-19: qty 1

## 2013-05-19 MED ORDER — DIPHENHYDRAMINE HCL 25 MG PO CAPS: 25.0000 mg | ORAL_CAPSULE | Freq: Four times a day (QID) | ORAL | Status: DC | PRN

## 2013-05-19 MED ORDER — HYDROMORPHONE HCL PF 1 MG/ML IJ SOLN
1.0000 mg | Freq: Once | INTRAMUSCULAR | Status: AC
  Administered 2013-05-19: 1 mg via INTRAVENOUS
  Filled 2013-05-19: qty 1

## 2013-05-19 MED ORDER — ACETAMINOPHEN 325 MG PO TABS: 650.0000 mg | ORAL_TABLET | Freq: Four times a day (QID) | ORAL | Status: DC | PRN

## 2013-05-19 MED ORDER — LIRAGLUTIDE 18 MG/3ML ~~LOC~~ SOPN: 1.8000 mg | PEN_INJECTOR | Freq: Every evening | SUBCUTANEOUS | Status: DC

## 2013-05-19 MED ORDER — HYDROMORPHONE HCL PF 1 MG/ML IJ SOLN
1.0000 mg | Freq: Once | INTRAMUSCULAR | Status: AC
Start: 2013-05-19 — End: 2013-05-19
  Administered 2013-05-19: 1 mg via INTRAVENOUS
  Filled 2013-05-19: qty 1

## 2013-05-19 MED ORDER — POLYETHYLENE GLYCOL 3350 17 G PO PACK
17.0000 g | PACK | Freq: Every day | ORAL | Status: DC | PRN
  Filled 2013-05-19: qty 1

## 2013-05-19 MED ORDER — ONDANSETRON HCL 4 MG PO TABS
4.0000 mg | ORAL_TABLET | Freq: Four times a day (QID) | ORAL | Status: DC | PRN
  Administered 2013-05-20: 4 mg via ORAL
  Filled 2013-05-19: qty 1

## 2013-05-19 MED ORDER — ACETAMINOPHEN 650 MG RE SUPP: 650.0000 mg | Freq: Four times a day (QID) | RECTAL | Status: DC | PRN

## 2013-05-19 MED ORDER — IOHEXOL 350 MG/ML SOLN
100.0000 mL | Freq: Once | INTRAVENOUS | Status: AC | PRN
  Administered 2013-05-19: 100 mL via INTRAVENOUS

## 2013-05-19 MED ORDER — HYDROCODONE-ACETAMINOPHEN 5-325 MG PO TABS
1.0000 | ORAL_TABLET | ORAL | Status: DC | PRN
  Administered 2013-05-20 (×3): 2 via ORAL
  Administered 2013-05-21: 1 via ORAL
  Filled 2013-05-19 (×4): qty 2

## 2013-05-19 MED ORDER — ALBUTEROL SULFATE (2.5 MG/3ML) 0.083% IN NEBU
2.5000 mg | INHALATION_SOLUTION | Freq: Once | RESPIRATORY_TRACT | Status: AC
  Administered 2013-05-19: 2.5 mg via RESPIRATORY_TRACT
  Filled 2013-05-19: qty 3

## 2013-05-19 MED ORDER — DOXYCYCLINE HYCLATE 100 MG IV SOLR
100.0000 mg | Freq: Two times a day (BID) | INTRAVENOUS | Status: DC
  Administered 2013-05-19 – 2013-05-21 (×4): 100 mg via INTRAVENOUS
  Filled 2013-05-19 (×5): qty 100

## 2013-05-19 MED ORDER — DOXYCYCLINE HYCLATE 100 MG PO TABS
100.0000 mg | ORAL_TABLET | Freq: Two times a day (BID) | ORAL | Status: DC
  Filled 2013-05-19: qty 1

## 2013-05-19 MED ORDER — HEPARIN SODIUM (PORCINE) 5000 UNIT/ML IJ SOLN
5000.0000 [IU] | Freq: Three times a day (TID) | INTRAMUSCULAR | Status: DC
  Administered 2013-05-19 – 2013-05-20 (×4): 5000 [IU] via SUBCUTANEOUS
  Filled 2013-05-19 (×8): qty 1

## 2013-05-19 MED ORDER — INSULIN ASPART 100 UNIT/ML ~~LOC~~ SOLN
0.0000 [IU] | Freq: Every day | SUBCUTANEOUS | Status: DC
  Administered 2013-05-20: 2 [IU] via SUBCUTANEOUS

## 2013-05-19 MED ORDER — ONDANSETRON HCL 4 MG/2ML IJ SOLN: 4.0000 mg | Freq: Four times a day (QID) | INTRAMUSCULAR | Status: DC | PRN

## 2013-05-19 MED ORDER — POTASSIUM CHLORIDE CRYS ER 20 MEQ PO TBCR: 40.0000 meq | EXTENDED_RELEASE_TABLET | Freq: Once | ORAL | Status: DC

## 2013-05-19 MED ORDER — MORPHINE SULFATE 2 MG/ML IJ SOLN
2.0000 mg | INTRAMUSCULAR | Status: DC | PRN
  Administered 2013-05-19 – 2013-05-21 (×3): 2 mg via INTRAVENOUS
  Filled 2013-05-19 (×3): qty 1

## 2013-05-19 MED ORDER — IPRATROPIUM BROMIDE 0.02 % IN SOLN
0.5000 mg | Freq: Once | RESPIRATORY_TRACT | Status: AC
  Administered 2013-05-19: 0.5 mg via RESPIRATORY_TRACT
  Filled 2013-05-19: qty 2.5

## 2013-05-19 MED ORDER — INSULIN DETEMIR 100 UNIT/ML ~~LOC~~ SOLN
45.0000 [IU] | Freq: Every day | SUBCUTANEOUS | Status: DC
  Administered 2013-05-20 – 2013-05-21 (×2): 45 [IU] via SUBCUTANEOUS
  Filled 2013-05-19 (×3): qty 0.45

## 2013-05-19 MED ORDER — SODIUM CHLORIDE 0.9 % IV SOLN
1.0000 g | Freq: Three times a day (TID) | INTRAVENOUS | Status: DC
  Administered 2013-05-19 – 2013-05-20 (×4): 1 g via INTRAVENOUS
  Filled 2013-05-19 (×7): qty 1

## 2013-05-19 MED ORDER — SODIUM CHLORIDE 0.9 % IV BOLUS (SEPSIS)
1000.0000 mL | Freq: Once | INTRAVENOUS | Status: AC
  Administered 2013-05-19: 1000 mL via INTRAVENOUS

## 2013-05-19 MED ORDER — INSULIN ASPART 100 UNIT/ML ~~LOC~~ SOLN
0.0000 [IU] | Freq: Three times a day (TID) | SUBCUTANEOUS | Status: DC
  Administered 2013-05-20: 5 [IU] via SUBCUTANEOUS

## 2013-05-19 NOTE — H&P (Signed)
[facility]                                                                                    Patient Demographics  Anita Morgan, is a 48 y.o. female  MRN: [id number]   DOB - [birth year]  Admit Date - 05/19/2013  Outpatient Primary MD for the patient is Rachell Cipro, MD   With History of -  Past Medical History  Diagnosis Date  . Diabetes mellitus without complication   . Sickle cell anemia   . Bronchitis   . Ruptured lumbar disc   . PONV (postoperative nausea and vomiting)   . DVT (deep vein thrombosis) in pregnancy 2007    right leg      Past Surgical History  Procedure Laterality Date  . Cesarean section      in for   Chief Complaint  Patient presents with  . Shortness of Breath     HPI  Anita Morgan  is a 48 y.o. female, history of chronic back pain due to a ruptured disc in the past, DVT in the right leg in the setting of pregnancy several years ago, sickle cell, smoking which is ongoing and counseled to quit, bronchitis, whose been having productive cough, shortness of breath and some right-sided pleuritic chest pain for the last 7-10 days, she's been seeing her PCP and was initially tried on azithromycin and then switched to Levaquin without much benefit, she comes to the ER for the same reason. In the ER chest x-ray suggestive of right lower lobe infiltrate, she does not have a fever, WBC count is top normal. Was called to admit the patient for community pneumonia failed outpatient treatment.   Patient currently besides the chest symptoms above has her chronic low back pain, some loose stools since she's been on antibiotics, no other subjective complaints.    Review of Systems    In addition to the HPI above, No Fever-some subjective chills at home, no body aches No Headache, No changes with Vision or hearing, No problems swallowing food or Liquids, Positive right-sided pleuritic Chest pain positive productive cough Cough and Shortness of Breath, No  Abdominal pain, No Nausea or Vommitting, Bowel movements are loose, No Blood in stool or Urine, No dysuria, No new skin rashes or bruises, No new joints pains-aches,  No new weakness, tingling, numbness in any extremity, some swelling in the right lower extremity No recent weight gain or loss, No polyuria, polydypsia or polyphagia, No significant Mental Stressors.  A full 10 point Review of Systems was done, except as stated above, all other Review of Systems were negative.   Social History History  Substance Use Topics  . Smoking status: Current Every Day Smoker -- 0.75 packs/day for 16 years    Types: Cigarettes  . Smokeless tobacco: Never Used  . Alcohol Use: No      Family History Family History  Problem Relation Age of Onset  . Diabetes Mother       Prior to Admission medications   Medication Sig Start Date End Date Taking? Authorizing Provider  diphenhydrAMINE (BENADRYL) 25 mg capsule Take 1 capsule (25 mg total) by mouth every 6 (six)  hours as needed for itching. 02/15/13  Yes Teressa Lower, MD  guaiFENesin (MUCINEX) 600 MG 12 hr tablet Take 600 mg by mouth 2 (two) times daily as needed for to loosen phlegm.   Yes Historical Provider, MD  insulin aspart (NOVOLOG) 100 UNIT/ML injection Inject 2-6 Units into the skin 3 (three) times daily with meals. Sliding scale   Yes Historical Provider, MD  Liraglutide (VICTOZA) 18 MG/3ML SOPN Inject 18 mg into the skin every evening.   Yes Historical Provider, MD  oxyCODONE-acetaminophen (PERCOCET) 10-325 MG per tablet Take 1 tablet by mouth every 4 (four) hours as needed for pain.   Yes Historical Provider, MD  EPINEPHrine (EPIPEN) 0.3 mg/0.3 mL SOAJ injection Inject 0.3 mLs (0.3 mg total) into the muscle as needed. 02/15/13   Teressa Lower, MD  insulin detemir (LEVEMIR) 100 UNIT/ML injection Inject 30-45 Units into the skin 2 (two) times daily. 45 units in the morning and 30 units in the evening.    Historical Provider, MD     Allergies  Allergen Reactions  . Bee Venom Anaphylaxis    Spiders   . Influenza Vaccines Hives  . Other Hives    Pneumonia vaccine causes hives  . Penicillins Anaphylaxis    Physical Exam  Vitals  Blood pressure 152/88, pulse 74, temperature 98.4 F (36.9 C), temperature source Oral, resp. rate 18, height 5\' 9"  (1.753 m), weight 138.619 kg (305 lb 9.6 oz), last menstrual period 05/02/2013, SpO2 99.00%.   1. General middle-aged morbidly obese African American lying in bed in NAD,     2. Normal affect and insight, Not Suicidal or Homicidal, Awake Alert, Oriented X 3.  3. No F.N deficits, ALL C.Nerves Intact, Strength 5/5 all 4 extremities, Sensation intact all 4 extremities, Plantars down going.  4. Ears and Eyes appear Normal, Conjunctivae clear, PERRLA. Moist Oral Mucosa.  5. Supple Neck, No JVD, No cervical lymphadenopathy appriciated, No Carotid Bruits.  6. Symmetrical Chest wall movement, Good air movement bilaterally, few right lower lobe crackles  7. RRR, No Gallops, Rubs or Murmurs, No Parasternal Heave.  8. Positive Bowel Sounds, Abdomen Soft, Non tender, No organomegaly appriciated,No rebound -guarding or rigidity.  9.  No Cyanosis, Normal Skin Turgor, No Skin Rash or Bruise. Trace lower extremity edema right more than left  10. Good muscle tone,  joints appear normal , no effusions, Normal ROM.  11. No Palpable Lymph Nodes in Neck or Axillae     Data Review  CBC  Recent Labs Lab 05/19/13 1550  WBC 10.4  HGB 11.5*  HCT 35.1*  PLT 292  MCV 84.4  MCH 27.6  MCHC 32.8  RDW 13.6  LYMPHSABS 3.9  MONOABS 0.6  EOSABS 0.1  BASOSABS 0.0   ------------------------------------------------------------------------------------------------------------------  Chemistries   Recent Labs Lab 05/19/13 1550  NA 141  K 3.3*  CL 101  CO2 27  GLUCOSE 276*  BUN 9  CREATININE 0.62  CALCIUM 8.9  AST 12  ALT 9  ALKPHOS 84  BILITOT 0.4    ------------------------------------------------------------------------------------------------------------------ estimated creatinine clearance is 132.1 ml/min (by C-G formula based on Cr of 0.62). ------------------------------------------------------------------------------------------------------------------ No results found for this basename: TSH, T4TOTAL, FREET3, T3FREE, THYROIDAB,  in the last 72 hours   Coagulation profile No results found for this basename: INR, PROTIME,  in the last 168 hours ------------------------------------------------------------------------------------------------------------------- No results found for this basename: DDIMER,  in the last 72 hours -------------------------------------------------------------------------------------------------------------------  Cardiac Enzymes No results found for this basename: CK, CKMB, TROPONINI, MYOGLOBIN,  in  the last 168 hours ------------------------------------------------------------------------------------------------------------------ No components found with this basename: POCBNP,    ---------------------------------------------------------------------------------------------------------------  Urinalysis    Component Value Date/Time   COLORURINE YELLOW 01/31/2013 0545   APPEARANCEUR CLOUDY* 01/31/2013 0545   LABSPEC 1.015 01/31/2013 0545   PHURINE 6.0 01/31/2013 0545   GLUCOSEU NEGATIVE 01/31/2013 0545   HGBUR TRACE* 01/31/2013 [place] 01/31/2013 0545   [place] 01/31/2013 0545   PROTEINUR NEGATIVE 01/31/2013 0545   UROBILINOGEN 1.0 01/31/2013 0545   NITRITE NEGATIVE 01/31/2013 0545   LEUKOCYTESUR NEGATIVE 01/31/2013 0545    ----------------------------------------------------------------------------------------------------------------  Imaging results:   Dg Chest 2 View  05/19/2013   CLINICAL DATA:  Chest pain for 2 weeks, worse on the left  EXAM: CHEST  2 VIEW   COMPARISON:  05/15/2013  FINDINGS: Heart size upper normal to mildly enlarged. Vascular pattern normal. There are increased interstitial markings diffusely bilaterally, increased slightly in conspicuity when compared to the prior study allowing for some difference in technique. More focal patchy opacity in the right lower lobe is again identified, similar to slightly worse. No pleural effusion.  IMPRESSION: There has been slight change in technique. Allowing for this, there is likely only mild interval change, with slightly worse infiltrate in the right lower lobe. There is also now evidence of mild bilateral diffuse interstitial prominence. Findings would suggest atypical bilateral pneumonia.   Electronically Signed   By: Skipper Cliche M.D.   On: 05/19/2013 15:45   Dg Chest 2 View  05/15/2013   CLINICAL DATA:  Asthma, back pain  EXAM: CHEST  2 VIEW  COMPARISON:  None.  FINDINGS: Borderline cardiomegaly. Diffuse central bronchitic changes and peribronchial cuffing. Ill-defined patchy opacity in the right lung base. No pleural effusion or pneumothorax. No acute osseous abnormality.  IMPRESSION: 1. Ill-defined patchy opacity in the right base on the frontal view may reflect early pneumonia. 2. Diffuse central airway thickening and peribronchial cuffing as can be seen an inflammatory conditions such as asthma, as well as an acute bronchitis and viral respiratory infection.   Electronically Signed   By: Jacqulynn Cadet M.D.   On: 05/15/2013 13:08    My personal review of EKG: Rhythm NSR,  no Acute ST changes    Assessment & Plan   1.CAP - failed outpatient azithromycin and Levaquin treatment, infiltrate his right lower lobe, will admit to MedSurg bed, sputum blood cultures, will also check HIV status, place on IV meropenem as she is penicillin allergic along with doxycycline, will have speech evaluate the patient, doubt there is ongoing aspiration. We'll also check CT scan angiogram of the chest in the  light of her smoking to rule out any chance of underlying malignancy or atypical pathology, also has underlying history of DVT. Given nebulizer treatments as needed.   2. Diabetes mellitus type 2. Check A1c, continue home dose Levemir and Victozia, place on sliding scale. Hold Glucophage.    3. Chronic low back pain. Pain control.    4. History of sickle cell. No acute issues.   5. Hypokalemia. Replaced.    6. Asymmetric edema in legs. Will check right lower extremity venous duplex, she has previous history of DVT in that leg.      Will check urine pregnancy thereafter ordered CT scan.    DVT Prophylaxis Heparin   AM Labs Ordered, also please review Full Orders  Family Communication: Admission, patients condition and plan of care including tests being ordered have been discussed with the patient and husband who  indicate understanding and agree with the plan and Code Status.  Code Status Full  Likely DC to  Home  Condition Fair  Time spent in minutes : 35    Newell Wafer K M.D on 05/19/2013 at 6:20 PM  Between 7am to 7pm - Pager - [phone]  After 7pm go to [email or web address] - password [email or web address]  And look for the night coverage person covering me after hours  [facility] Office  [phone]

## 2013-05-19 NOTE — Progress Notes (Signed)
ANTIBIOTIC CONSULT NOTE - INITIAL  Pharmacy Consult for Meropenem, Antibiotic renal dose adjustment Indication: pneumonia  Allergies  Allergen Reactions  . Bee Venom Anaphylaxis    Spiders   . Penicillins Anaphylaxis    Patient Measurements: Height: 5\' 9"  (175.3 cm) Weight: 305 lb 9.6 oz (138.619 kg) IBW/kg (Calculated) : 66.2 Adjusted Body Weight:   Vital Signs: Temp: 98.4 F (36.9 C) (01/16 1447) Temp src: Oral (01/16 1447) BP: 152/88 mmHg (01/16 1739) Pulse Rate: 74 (01/16 1739) Intake/Output from previous day:   Intake/Output from this shift:    Labs:  Recent Labs  05/19/13 1550  WBC 10.4  HGB 11.5*  PLT 292  CREATININE 0.62   Estimated Creatinine Clearance: 132.1 ml/min (by C-G formula based on Cr of 0.62). No results found for this basename: VANCOTROUGH, VANCOPEAK, VANCORANDOM, GENTTROUGH, GENTPEAK, GENTRANDOM, TOBRATROUGH, TOBRAPEAK, TOBRARND, AMIKACINPEAK, AMIKACINTROU, AMIKACIN,  in the last 72 hours   Microbiology: No results found for this or any previous visit (from the past 720 hour(s)).  Medical History: Past Medical History  Diagnosis Date  . Diabetes mellitus without complication   . Sickle cell anemia   . Bronchitis   . Ruptured lumbar disc   . PONV (postoperative nausea and vomiting)   . DVT (deep vein thrombosis) in pregnancy 2007    right leg     Assessment: 31 yoF with sickle cell anemia, DM, and ruptured lumbar disc presents with CAP that was recently treated with azithromycin and then Levaquin outpatient with no improvement in symptoms.  Patient complains of shortness of breath, fevers/chills, and chest pain (patient unsure if this is from sickle cell pain).  CXR shows pneumonia.  Patient has allergy to penicillin with reaction of anaphylaxis.  Patient on doxycyline and MD ordered Meropenem per pharmacy for CAP that failed outpatient treatment.  Meropenem should be low risk of cross reactivity in regards to penicillin allergy, but RN  notified to monitor patient.  WBC 10.4  SCr 0.62, CrCl>100 ml/min  Afebrile  Blood, sputum cultures ordered  Goal of Therapy:  Eradication of infection  Plan:  1.  Meropenem 1g IV q8h. 2.  No adjustment to doxycycline dose. 3.  F/u SCr, culture results, clinical course. 4.  Note that if CXR suggests atypical bilateral pneumonia and patient demonstrates improvement on IV abx, could narrow to doxycycline alone for treatment of atypical pneumonia?    Hershal Coria 05/19/2013,6:17 PM

## 2013-05-19 NOTE — Progress Notes (Signed)
Pt had non-productive strong cough. Good effort. Pt states she cannot produce sputum at this time. RT left sputum culture container with patient and instructed pt that if able to cough up anything to place in cup and notify RN.

## 2013-05-19 NOTE — ED Notes (Signed)
Pt o2 dropped to 87% while walking

## 2013-05-19 NOTE — ED Notes (Signed)
Pt reports was dx last week with PNA and went to PCP today and not getting any better.  Sent here by PCP.  Cough present.  Pt reports fevers.

## 2013-05-19 NOTE — ED Provider Notes (Signed)
CSN: [id number]     Arrival date & time 05/19/13  1427 History   First MD Initiated Contact with Patient 05/19/13 1501     Chief Complaint  Patient presents with  . Shortness of Breath   (Consider location/radiation/quality/duration/timing/severity/associated sxs/prior Treatment) HPI Comments: Patient presents to the ED with a chief complaint of shortness of breath.  She states that this problem has been bothering her for the past 2 weeks.  She states that she was seen by her PCP and was given azithromycin.  She states that she has been having nausea, vomiting, and diarrhea since starting the antibiotic, but denies any hematemesis or melena.  She also complains of chest pain, but associates it with the coughing.  She reports abdominal pain, and associates it with vomiting.  She reports low-back pain, which she associates with the coughing and vomiting.  She states that she feels dehydrated.  She endorses subjective fevers and chills.  Additionally, she states that she has DM and sickle cell.  She is uncertain if this is her sickle cell pain, but states that she is quite uncomfortable.  The history is provided by the patient. No language interpreter was used.    Past Medical History  Diagnosis Date  . Diabetes mellitus without complication   . Sickle cell anemia   . Bronchitis   . Ruptured lumbar disc    Past Surgical History  Procedure Laterality Date  . Cesarean section     Family History  Problem Relation Age of Onset  . Diabetes Mother    History  Substance Use Topics  . Smoking status: Current Every Day Smoker -- 0.75 packs/day    Types: Cigarettes  . Smokeless tobacco: Never Used  . Alcohol Use: No   OB History   Grav Para Term Preterm Abortions TAB SAB Ect Mult Living   5 4 3 1 1  1         Review of Systems  All other systems reviewed and are negative.    Allergies  Bee venom and Penicillins  Home Medications   Current Outpatient Rx  Name  Route  Sig  Dispense   Refill  . albuterol (PROVENTIL) (2.5 MG/3ML) 0.083% nebulizer solution   Nebulization   Take 3 mLs (2.5 mg total) by nebulization every 4 (four) hours as needed for wheezing or shortness of breath.   30 vial   0   . azithromycin (ZITHROMAX Z-PAK) 250 MG tablet      2 po day one, then 1 daily x 4 days   6 tablet   0   . diphenhydrAMINE (BENADRYL) 25 mg capsule   Oral   Take 1 capsule (25 mg total) by mouth every 6 (six) hours as needed for itching.   30 capsule   0   . guaifenesin (DIABETIC TUSSIN EX) 100 MG/5ML syrup   Oral   Take 200 mg by mouth 3 (three) times daily as needed for cough.         Marland Kitchen guaiFENesin (MUCINEX) 600 MG 12 hr tablet   Oral   Take 600 mg by mouth 2 (two) times daily as needed for to loosen phlegm.         . insulin aspart (NOVOLOG) 100 UNIT/ML injection   Subcutaneous   Inject 2-6 Units into the skin 3 (three) times daily with meals. Sliding scale         . levofloxacin (LEVAQUIN) 750 MG tablet   Oral   Take 750 mg by mouth  daily.         . Liraglutide (VICTOZA) 18 MG/3ML SOPN   Subcutaneous   Inject 18 mg into the skin every evening.         . metFORMIN (GLUCOPHAGE) 500 MG tablet   Oral   Take 500 mg by mouth 2 (two) times daily with a meal.         . oxyCODONE-acetaminophen (PERCOCET) 10-325 MG per tablet   Oral   Take 1 tablet by mouth every 4 (four) hours as needed for pain.         Marland Kitchen EPINEPHrine (EPIPEN) 0.3 mg/0.3 mL SOAJ injection   Intramuscular   Inject 0.3 mLs (0.3 mg total) into the muscle as needed.   1 Device   1   . insulin detemir (LEVEMIR) 100 UNIT/ML injection   Subcutaneous   Inject 30-45 Units into the skin 2 (two) times daily. 45 units in the morning and 30 units in the evening.          BP 178/84  Pulse 95  Temp(Src) 98.4 F (36.9 C) (Oral)  Resp 20  Ht 5\' 9"  (1.753 m)  Wt 305 lb 9.6 oz (138.619 kg)  BMI 45.11 kg/m2  SpO2 96%  LMP 05/02/2013 Physical Exam  Nursing note and vitals  reviewed. Constitutional: She is oriented to person, place, and time. She appears well-developed and well-nourished.  obese  HENT:  Head: Normocephalic and atraumatic.  Eyes: Conjunctivae and EOM are normal. Pupils are equal, round, and reactive to light.  Neck: Normal range of motion. Neck supple.  Cardiovascular: Normal rate, regular rhythm and normal heart sounds.  Exam reveals no gallop and no friction rub.   No murmur heard. Pulmonary/Chest: Effort normal. No respiratory distress. She has no wheezes. She has rales. She exhibits tenderness.  Coarse rales bilaterally, but more in the left lower lobe  Moderate upper chest wall tenderness  Abdominal: Soft. Bowel sounds are normal. She exhibits no distension and no mass. There is tenderness. There is no rebound and no guarding.  Diffuse abdominal discomfort, no focal tenderness, no Murphy's sign, no pain at McBurney's point, no fluid wave or signs of peritonitis  Musculoskeletal: Normal range of motion. She exhibits no edema and no tenderness.  Lumbar paraspinal muscles are ttp, moves all extremities  Neurological: She is alert and oriented to person, place, and time.  Skin: Skin is warm and dry.  Psychiatric: She has a normal mood and affect. Her behavior is normal. Judgment and thought content normal.    ED Course  Procedures (including critical care time) Results for orders placed during the hospital encounter of 05/19/13  CBC WITH DIFFERENTIAL      Result Value Range   WBC 10.4  4.0 - 10.5 K/uL   RBC 4.16  3.87 - 5.11 MIL/uL   Hemoglobin 11.5 (*) 12.0 - 15.0 g/dL   HCT 35.1 (*) 36.0 - 46.0 %   MCV 84.4  78.0 - 100.0 fL   MCH 27.6  26.0 - 34.0 pg   MCHC 32.8  30.0 - 36.0 g/dL   RDW 13.6  11.5 - 15.5 %   Platelets 292  150 - 400 K/uL   Neutrophils Relative % 55  43 - 77 %   Neutro Abs 5.7  1.7 - 7.7 K/uL   Lymphocytes Relative 38  12 - 46 %   Lymphs Abs 3.9  0.7 - 4.0 K/uL   Monocytes Relative 6  3 - 12 %   Monocytes  Absolute 0.6  0.1 - 1.0 K/uL   Eosinophils Relative 1  0 - 5 %   Eosinophils Absolute 0.1  0.0 - 0.7 K/uL   Basophils Relative 0  0 - 1 %   Basophils Absolute 0.0  0.0 - 0.1 K/uL  COMPREHENSIVE METABOLIC PANEL      Result Value Range   Sodium 141  137 - 147 mEq/L   Potassium 3.3 (*) 3.7 - 5.3 mEq/L   Chloride 101  96 - 112 mEq/L   CO2 27  19 - 32 mEq/L   Glucose, Bld 276 (*) 70 - 99 mg/dL   BUN 9  6 - 23 mg/dL   Creatinine,1.61Ser 0.62  0.50 - 1.10 mg/dL   Calcium 8.9  8.4 - 09.6 mg/dL   Total Protein 7.0  6.0 - 8.3 g/dL   Albumin 3.2 (*) 3.5 - 5.2 g/dL   AST 12  0 - 37 U/L   ALT 9  0 - 35 U/L   Alkaline Phosphatase 84  39 - 117 U/L   Total Bilirubin 0.4  0.3 - 1.2 mg/dL   GFR calc non Af Amer >90  >90 mL/min   GFR calc Af Amer >90  >90 mL/min  LIPASE, BLOOD      Result Value Range   Lipase 12  11 - 59 U/L  RETICULOCYTES      Result Value Range   Retic Ct Pct 2.1  0.4 - 3.1 %   RBC. 4.16  3.87 - 5.11 MIL/uL   Retic Count, Manual 87.4  19.0 - 186.0 K/uL   Dg Chest 2 View  05/19/2013   CLINICAL DATA:  Chest pain for 2 weeks, worse on the left  EXAM: CHEST  2 VIEW  COMPARISON:  05/15/2013  FINDINGS: Heart size upper normal to mildly enlarged. Vascular pattern normal. There are increased interstitial markings diffusely bilaterally, increased slightly in conspicuity when compared to the prior study allowing for some difference in technique. More focal patchy opacity in the right lower lobe is again identified, similar to slightly worse. No pleural effusion.  IMPRESSION: There has been slight change in technique. Allowing for this, there is likely only mild interval change, with slightly worse infiltrate in the right lower lobe. There is also now evidence of mild bilateral diffuse interstitial prominence. Findings would suggest atypical bilateral pneumonia.   Electronically Signed   By: Esperanza Heir M.D.   On: 05/19/2013 15:45   Dg Chest 2 View  05/15/2013   CLINICAL DATA:  Asthma, back  pain  EXAM: CHEST  2 VIEW  COMPARISON:  None.  FINDINGS: Borderline cardiomegaly. Diffuse central bronchitic changes and peribronchial cuffing. Ill-defined patchy opacity in the right lung base. No pleural effusion or pneumothorax. No acute osseous abnormality.  IMPRESSION: 1. Ill-defined patchy opacity in the right base on the frontal view may reflect early pneumonia. 2. Diffuse central airway thickening and peribronchial cuffing as can be seen an inflammatory conditions such as asthma, as well as an acute bronchitis and viral respiratory infection.   Electronically Signed   By: Malachy Moan M.D.   On: 05/15/2013 13:08      EKG Interpretation   None       MDM  1. Pneumonia 1. Ruptured lumbar disc   2. Diabetes mellitus without complication   3. Sickle cell anemia     Patient with many complaints, chiefly, persistent cough and SOB.  Will check labs, give breathing treatment, fluids, pain meds, and zofran.  Will  reassess.  Patient has been recently treated for CAP with azithromycin.  No improvement in symptoms.  Patient discussed with Dr. Dorna Mai, who tells me to admit the patient for failing outpatient therapy.    Montine Circle, PA-C 05/19/13 [phone]

## 2013-05-19 NOTE — ED Provider Notes (Signed)
Medical screening examination/treatment/procedure(s) were performed by a resident physician or non-physician practitioner and as the supervising physician I was immediately available for consultation/collaboration.  Lynne Leader, MD    Gregor Hams, MD 05/19/13 [phone]

## 2013-05-19 NOTE — Progress Notes (Signed)
Notified Radiology to hold CT until urine pregnancy results have returned negative per Dr, Candiss Norse

## 2013-05-19 NOTE — Progress Notes (Signed)
Ur completed     

## 2013-05-19 NOTE — ED Provider Notes (Addendum)
Medical screening examination/treatment/procedure(s) were performed by non-physician practitioner and as supervising physician I was immediately available for consultation/collaboration.  EKG Interpretation    Date/Time:  Friday May 19 2013 15:45:34 EST Ventricular Rate:  85 PR Interval:  206 QRS Duration: 96 QT Interval:  427 QTC Calculation: 508 R Axis:   -17 Text Interpretation:  Sinus rhythm Borderline prolonged PR interval Borderline left axis deviation Low voltage, precordial leads Poor R wave progression Abnormal ECG No previous tracing Confirmed by [facility]  MD, MICHEAL ([id number]) on 05/19/2013 6:18:45 PM              Saddie Benders. Dorna Mai, MD 05/19/13 [place] Raimi Guillermo, MD 05/19/13 1818

## 2013-05-20 DIAGNOSIS — M7989 Other specified soft tissue disorders: Secondary | ICD-10-CM

## 2013-05-20 LAB — GLUCOSE, CAPILLARY
GLUCOSE-CAPILLARY: 257 mg/dL — AB (ref 70–99)
GLUCOSE-CAPILLARY: 231 mg/dL — AB (ref 70–99)
Glucose-Capillary: 153 mg/dL — ABNORMAL HIGH (ref 70–99)
Glucose-Capillary: 122 mg/dL — ABNORMAL HIGH (ref 70–99)
Glucose-Capillary: 170 mg/dL — ABNORMAL HIGH (ref 70–99)

## 2013-05-20 LAB — HEMOGLOBIN A1C
HEMOGLOBIN A1C: 8.5 % — AB (ref <5.7)
Mean Plasma Glucose: 197 mg/dL — ABNORMAL HIGH (ref <117)

## 2013-05-20 LAB — HIV ANTIBODY (ROUTINE TESTING W REFLEX): HIV: NONREACTIVE

## 2013-05-20 LAB — BASIC METABOLIC PANEL
BUN: 6 mg/dL (ref 6–23)
CALCIUM: 8.2 mg/dL — AB (ref 8.4–10.5)
CO2: 26 meq/L (ref 19–32)
Chloride: 103 mEq/L (ref 96–112)
Creatinine, Ser: 0.62 mg/dL (ref 0.50–1.10)
GFR calc Af Amer: >90 mL/min (ref >90)
GLUCOSE: 132 mg/dL — AB (ref 70–99)
Potassium: 3.2 mEq/L — ABNORMAL LOW (ref 3.7–5.3)
Sodium: 140 mEq/L (ref 137–147)

## 2013-05-20 LAB — CBC
HCT: 32.2 % — ABNORMAL LOW (ref 36.0–46.0)
Hemoglobin: 10.1 g/dL — ABNORMAL LOW (ref 12.0–15.0)
MCH: 26.7 pg (ref 26.0–34.0)
MCHC: 31.4 g/dL (ref 30.0–36.0)
MCV: 85.2 fL (ref 78.0–100.0)
Platelets: 255 10*3/uL (ref 150–400)
RBC: 3.78 MIL/uL — ABNORMAL LOW (ref 3.87–5.11)
RDW: 13.9 % (ref 11.5–15.5)
WBC: 8.8 10*3/uL (ref 4.0–10.5)

## 2013-05-20 LAB — INFLUENZA PANEL BY PCR (TYPE A & B)
H1N1FLUPCR: NOT DETECTED
Influenza A By PCR: NEGATIVE
Influenza B By PCR: NEGATIVE

## 2013-05-20 MED ORDER — POTASSIUM CHLORIDE CRYS ER 20 MEQ PO TBCR
40.0000 meq | EXTENDED_RELEASE_TABLET | Freq: Once | ORAL | Status: AC
  Administered 2013-05-20: 40 meq via ORAL
  Filled 2013-05-20: qty 2

## 2013-05-20 NOTE — Progress Notes (Signed)
[facility] PROGRESS NOTE  Delcia Spitzley UJW:[id number] DOB: 03-Jul-1965 DOA: 05/19/2013 PCP: Maryelizabeth Rowan, MD  Assessment/Plan: Community acquired pneumonia -patient treated with Levaquin and azithromycin as out for bronchitis.  -follow sputum and blood cultures, will also check HIV negative - Patient allergic to PCN, placed on  IV meropenem and  Doxycycline. CT scan angiogram of the chest  negative for mass or PE -continue IV fluids   Diabetes mellitus type 2.  Check A1c continue home dose Levemir and Victozia, on sliding scale.  Hold Glucophage.   Chronic low back pain.  Pain control.   History of sickle cell.  No acute issues.    Hypokalemia.  Replaced.   . Asymmetric edema in legs.   right lower extremity venous duplex negative for DVT  Code Status: full code Family Communication: none Disposition Plan: home in am if improved   Consultants:  none  Procedures:  none  Antibiotics:  IV   HPI/Subjective: Informs her breathing to be better. Still feels weak  Objective: Filed Vitals:   05/20/13 0515  BP: 119/73  Pulse: 77  Temp: 98 F (36.7 C)  Resp: 18    Intake/Output Summary (Last 24 hours) at 05/20/13 1355 Last data filed at 05/20/13 0900  Gross per 24 hour  Intake    480 ml  Output      0 ml  Net    480 ml   Filed Weights   05/19/13 1447 05/19/13 1931 05/20/13 0515  Weight: 138.619 kg (305 lb 9.6 oz) 137.2 kg (302 lb 7.5 oz) 137.2 kg (302 lb 7.5 oz)    Exam:   General: middle aged morbidly obese female in NAD  HEENT: no pallor, moist oral mucosa  Cardiovascular: NS1&S2, no murmurs, rubs or gallop  Respiratory: clear b/l, no added sounds  Abdomen:soft, NT, ND, BS+  Musculoskeletal: Warm, no edema  CNS: AAOX3   Data Reviewed: Basic Metabolic Panel:  Recent Labs Lab 05/19/13 1550 05/19/13 1940 05/20/13 0606  NA 141  --  140  K 3.3*  --  3.2*  CL 101  --  103  CO2 27  --  26  GLUCOSE 276*  --  132*  BUN 9  --   6  CREATININE 0.62 0.70 0.62  CALCIUM 8.9  --  8.2*   Liver Function Tests:  Recent Labs Lab 05/19/13 1550  AST 12  ALT 9  ALKPHOS 84  BILITOT 0.4  PROT 7.0  ALBUMIN 3.2*    Recent Labs Lab 05/19/13 1550  LIPASE 12   No results found for this basename: AMMONIA,  in the last 168 hours CBC:  Recent Labs Lab 05/19/13 1550 05/19/13 1940 05/20/13 0606  WBC 10.4 10.4 8.8  NEUTROABS 5.7  --   --   HGB 11.5* 10.8* 10.1*  HCT 35.1* 34.5* 32.2*  MCV 84.4 85.8 85.2  PLT 292 266 255   Cardiac Enzymes: No results found for this basename: CKTOTAL, CKMB, CKMBINDEX, TROPONINI,  in the last 168 hours BNP (last 3 results) No results found for this basename: PROBNP,  in the last 8760 hours CBG:  Recent Labs Lab 05/19/13 2127 05/20/13 0727 05/20/13 1159  GLUCAP 153* 122* 170*    Recent Results (from the past 240 hour(s))  CULTURE, BLOOD (ROUTINE X 2)     Status: None   Collection Time    05/19/13  7:25 PM      Result Value Range Status   Specimen Description BLOOD RIGHT HAND   Final  Special Requests BOTTLES DRAWN AEROBIC AND ANAEROBIC 10CC   Final   Culture  Setup Time     Final   Value: 05/19/2013 22:58     Performed at [facility]   Culture     Final   Value:        BLOOD CULTURE RECEIVED NO GROWTH TO DATE CULTURE WILL BE HELD FOR 5 DAYS BEFORE ISSUING A FINAL NEGATIVE REPORT     Performed at [facility]   Report Status PENDING   Incomplete  CULTURE, BLOOD (ROUTINE X 2)     Status: None   Collection Time    05/19/13  7:40 PM      Result Value Range Status   Specimen Description BLOOD RIGHT ARM   Final   Special Requests BOTTLES DRAWN AEROBIC AND ANAEROBIC 10CC   Final   Culture  Setup Time     Final   Value: 05/19/2013 22:58     Performed at [facility]   Culture     Final   Value:        BLOOD CULTURE RECEIVED NO GROWTH TO DATE CULTURE WILL BE HELD FOR 5 DAYS BEFORE ISSUING A FINAL NEGATIVE REPORT     Performed at [facility]   Report Status PENDING   Incomplete     Studies: Dg Chest 2 View  05/19/2013   CLINICAL DATA:  Chest pain for 2 weeks, worse on the left  EXAM: CHEST  2 VIEW  COMPARISON:  05/15/2013  FINDINGS: Heart size upper normal to mildly enlarged. Vascular pattern normal. There are increased interstitial markings diffusely bilaterally, increased slightly in conspicuity when compared to the prior study allowing for some difference in technique. More focal patchy opacity in the right lower lobe is again identified, similar to slightly worse. No pleural effusion.  IMPRESSION: There has been slight change in technique. Allowing for this, there is likely only mild interval change, with slightly worse infiltrate in the right lower lobe. There is also now evidence of mild bilateral diffuse interstitial prominence. Findings would suggest atypical bilateral pneumonia.   Electronically Signed   By: Skipper Cliche M.D.   On: 05/19/2013 15:45   Ct Angio Chest Pe W/cm &/or Wo Cm  05/19/2013   CLINICAL DATA:  Back pain, shortness of breath  EXAM: CT ANGIOGRAPHY CHEST WITH CONTRAST  TECHNIQUE: Multidetector CT imaging of the chest was performed using the standard protocol during bolus administration of intravenous contrast. Multiplanar CT image reconstructions including MIPs were obtained to evaluate the vascular anatomy.  CONTRAST:  167mL OMNIPAQUE IOHEXOL 350 MG/ML SOLN  COMPARISON:  Chest radiograph performed today  FINDINGS: The examination is limited by the patient's body habitus. Allowing for this, there is scattered multifocal geographic ground-glass attenuation in the bilateral lower lung zones, predominantly dependently. The deep tendon to lower lobe positioning of this abnormality suggests that it is due to atelectasis. There is no pleural effusion or consolidation.  The heart is mildly enlarged. There is no pericardial effusion. Due to limitations created by patient body habitus, evaluation of the pulmonary  arterial system is mildly limited. There are no large, central pulmonary arterial emboli. No emboli are appreciated in the more peripheral arterial vessels as well. The thoracic aorta shows no dissection or dilatation. There is mild calcification of the aortic arch.  Bone windows reveal no significant osseous abnormality. Scans through the upper abdomen are unremarkable.  Review of the MIP images confirms the above findings.  IMPRESSION:  Study limited by body habitus but there is no evidence of pulmonary embolism.  There is mild cardiac enlargement.  Predominantly in the dependent portions of both lower lobes, there is hazy ground-glass attenuation. Given the distribution, this is most likely due to atelectasis.   Electronically Signed   By: Skipper Cliche M.D.   On: 05/19/2013 19:34    Scheduled Meds: . doxycycline (VIBRAMYCIN) IV  100 mg Intravenous Q12H  . heparin  5,000 Units Subcutaneous Q8H  . insulin aspart  0-5 Units Subcutaneous QHS  . insulin aspart  0-9 Units Subcutaneous TID WC  . insulin detemir  30 Units Subcutaneous Q supper  . insulin detemir  45 Units Subcutaneous Q breakfast  . meropenem (MERREM) IV  1 g Intravenous Q8H  . potassium chloride  40 mEq Oral Once   Continuous Infusions:      Time spent: 25 minutes    Anastacio Bua, [place]  [facility] Pager [phone]. If 7PM-7AM, please contact night-coverage at [email or web address], password [facility] 05/20/2013, 1:55 PM  LOS: 1 day

## 2013-05-20 NOTE — Progress Notes (Signed)
VASCULAR LAB PRELIMINARY  PRELIMINARY  PRELIMINARY  PRELIMINARY  Bilateral lower extremity venous duplex completed.    Preliminary report:  Bilateral:  No evidence of DVT, superficial thrombosis, or Baker's Cyst.   Darinda [place], RVS 05/20/2013, 9:49 AM

## 2013-05-21 DIAGNOSIS — E876 Hypokalemia: Secondary | ICD-10-CM | POA: Diagnosis present

## 2013-05-21 LAB — GLUCOSE, CAPILLARY
Glucose-Capillary: 155 mg/dL — ABNORMAL HIGH (ref 70–99)
Glucose-Capillary: 107 mg/dL — ABNORMAL HIGH (ref 70–99)

## 2013-05-21 MED ORDER — LORAZEPAM 1 MG PO TABS
1.0000 mg | ORAL_TABLET | Freq: Once | ORAL | Status: AC
  Administered 2013-05-21: 1 mg via ORAL
  Filled 2013-05-21: qty 1

## 2013-05-21 MED ORDER — DOXYCYCLINE HYCLATE 50 MG PO CAPS: 50.0000 mg | ORAL_CAPSULE | Freq: Two times a day (BID) | ORAL | Status: DC

## 2013-05-21 NOTE — Discharge Summary (Addendum)
Physician Discharge Summary  Anita Morgan [id number] DOB: 22-Jul-1965 DOA: 05/19/2013  PCP: Rachell Cipro, MD  Admit date: 05/19/2013 Discharge date: 05/21/2013  Time spent: 40 minutes  Recommendations for Outpatient Follow-up:  Home with outpt follow up  Discharge Diagnoses:  Principal Problem:   CAP (community acquired pneumonia)  Active Problems:   Diabetes mellitus without complication   Ruptured lumbar disc   Sickle cell anemia   Severe obesity (BMI >= 40)   Discharge Condition: fair  Diet recommendation: diabetic  Filed Weights   05/19/13 1931 05/20/13 0515 05/21/13 0520  Weight: 137.2 kg (302 lb 7.5 oz) 137.2 kg (302 lb 7.5 oz) 138.1 kg (304 lb 7.3 oz)    History of present illness:  Please refer to admission H&P for details, but in brief, Anita Morgan is a 48 y.o. female, history of chronic back pain due to a ruptured disc in the past, DVT in the right leg in the setting of pregnancy several years ago, sickle cell, smoking which is ongoing and counseled to quit, bronchitis, whose been having productive cough, shortness of breath and some right-sided pleuritic chest pain for the last 7-10 days, she's been seeing her PCP and was initially tried on azithromycin and then switched to Levaquin without much benefit, she comes to the ER for the same reason. In the ER chest x-ray suggestive of right lower lobe infiltrate, she does not have a fever, WBC count was normal. Admitted for  community pneumonia with failed outpatient treatment.    Hospital Course:  Assessment/Plan:  Community acquired pneumonia  -patient treated with Levaquin and azithromycin as out for bronchitis.  - blood cultures negative so far. HIV negative  - Patient allergic to PCN, placed on IV meropenem and Doxycycline.  CT scan angiogram of the chest negative for mass or PE  -afebrile and clinically improved. will discharge on a course of oral doxycycline to complete a 7 day course.   Diabetes mellitus  type 2.  Check A1c of 8.5 continue home dose Insulin and oral hypoglycemic. Adjust dose during outpt follow up Counseled on weight loss and exercise  Chronic low back pain.  Pain control.   History of sickle cell.  No acute issues.    Hypokalemia.  Replaced.    Asymmetric edema in legs. (L>R) right lower extremity venous duplex negative for DVT   Morbid obesity counseled on weight loss and exercise  Patient clinically stable and SOB improved. Remains afebrile. Stable for d/c home.   Code Status: full code  Family Communication: none  Disposition Plan: home   Consultants:  none Procedures:  none Antibiotics:  IV Meropenem and doxycycline   Discharge Exam: Filed Vitals:   05/21/13 0520  BP: 136/83  Pulse: 75  Temp: 97.9 F (36.6 C)  Resp: 18    General: middle aged morbidly obese female in NAD  HEENT: no pallor, moist oral mucosa  Cardiovascular: NS1&S2, no murmurs, rubs or gallop  Respiratory: clear b/l, no added sounds  Abdomen:soft, NT, ND, BS+  Musculoskeletal: Warm, trace edema b/l ( L>R) CNS: AAOX3    Discharge Instructions     Medication List    STOP taking these medications       albuterol (2.5 MG/3ML) 0.083% nebulizer solution  Commonly known as:  PROVENTIL     azithromycin 250 MG tablet  Commonly known as:  ZITHROMAX Z-PAK     LEVAQUIN 750 MG tablet  Generic drug:  levofloxacin      TAKE these medications  diphenhydrAMINE 25 mg capsule  Commonly known as:  BENADRYL  Take 1 capsule (25 mg total) by mouth every 6 (six) hours as needed for itching.     doxycycline 50 MG capsule  Commonly known as:  VIBRAMYCIN  Take 1 capsule (50 mg total) by mouth 2 (two) times daily.     EPINEPHrine 0.3 mg/0.3 mL Soaj injection  Commonly known as:  EPIPEN  Inject 0.3 mLs (0.3 mg total) into the muscle as needed.     guaiFENesin 600 MG 12 hr tablet  Commonly known as:  MUCINEX  Take 600 mg by mouth 2 (two) times daily as needed for to  loosen phlegm.     insulin aspart 100 UNIT/ML injection  Commonly known as:  novoLOG  Inject 2-6 Units into the skin 3 (three) times daily with meals. Sliding scale     insulin detemir 100 UNIT/ML injection  Commonly known as:  LEVEMIR  Inject 30-45 Units into the skin 2 (two) times daily. 45 units in the morning and 30 units in the evening.     Liraglutide 18 MG/3ML Sopn  Inject 18 mg into the skin every evening.     oxyCODONE-acetaminophen 10-325 MG per tablet  Commonly known as:  PERCOCET  Take 1 tablet by mouth every 4 (four) hours as needed for pain.      ASK your doctor about these medications       metFORMIN 500 MG tablet  Commonly known as:  GLUCOPHAGE  Take 500 mg by mouth 2 (two) times daily with a meal.       Allergies  Allergen Reactions  . Bee Venom Anaphylaxis    Spiders   . Influenza Vaccines Hives  . Other Hives    Pneumonia vaccine causes hives  . Penicillins Anaphylaxis      The results of significant diagnostics from this hospitalization (including imaging, microbiology, ancillary and laboratory) are listed below for reference.    Significant Diagnostic Studies: Dg Chest 2 View  05/19/2013   CLINICAL DATA:  Chest pain for 2 weeks, worse on the left  EXAM: CHEST  2 VIEW  COMPARISON:  05/15/2013  FINDINGS: Heart size upper normal to mildly enlarged. Vascular pattern normal. There are increased interstitial markings diffusely bilaterally, increased slightly in conspicuity when compared to the prior study allowing for some difference in technique. More focal patchy opacity in the right lower lobe is again identified, similar to slightly worse. No pleural effusion.  IMPRESSION: There has been slight change in technique. Allowing for this, there is likely only mild interval change, with slightly worse infiltrate in the right lower lobe. There is also now evidence of mild bilateral diffuse interstitial prominence. Findings would suggest atypical bilateral  pneumonia.   Electronically Signed   By: Skipper Cliche M.D.   On: 05/19/2013 15:45   Dg Chest 2 View  05/15/2013   CLINICAL DATA:  Asthma, back pain  EXAM: CHEST  2 VIEW  COMPARISON:  None.  FINDINGS: Borderline cardiomegaly. Diffuse central bronchitic changes and peribronchial cuffing. Ill-defined patchy opacity in the right lung base. No pleural effusion or pneumothorax. No acute osseous abnormality.  IMPRESSION: 1. Ill-defined patchy opacity in the right base on the frontal view may reflect early pneumonia. 2. Diffuse central airway thickening and peribronchial cuffing as can be seen an inflammatory conditions such as asthma, as well as an acute bronchitis and viral respiratory infection.   Electronically Signed   By: Jacqulynn Cadet M.D.   On: 05/15/2013 13:08  Ct Angio Chest Pe W/cm &/or Wo Cm  05/19/2013   CLINICAL DATA:  Back pain, shortness of breath  EXAM: CT ANGIOGRAPHY CHEST WITH CONTRAST  TECHNIQUE: Multidetector CT imaging of the chest was performed using the standard protocol during bolus administration of intravenous contrast. Multiplanar CT image reconstructions including MIPs were obtained to evaluate the vascular anatomy.  CONTRAST:  168mL OMNIPAQUE IOHEXOL 350 MG/ML SOLN  COMPARISON:  Chest radiograph performed today  FINDINGS: The examination is limited by the patient's body habitus. Allowing for this, there is scattered multifocal geographic ground-glass attenuation in the bilateral lower lung zones, predominantly dependently. The deep tendon to lower lobe positioning of this abnormality suggests that it is due to atelectasis. There is no pleural effusion or consolidation.  The heart is mildly enlarged. There is no pericardial effusion. Due to limitations created by patient body habitus, evaluation of the pulmonary arterial system is mildly limited. There are no large, central pulmonary arterial emboli. No emboli are appreciated in the more peripheral arterial vessels as well. The  thoracic aorta shows no dissection or dilatation. There is mild calcification of the aortic arch.  Bone windows reveal no significant osseous abnormality. Scans through the upper abdomen are unremarkable.  Review of the MIP images confirms the above findings.  IMPRESSION: Study limited by body habitus but there is no evidence of pulmonary embolism.  There is mild cardiac enlargement.  Predominantly in the dependent portions of both lower lobes, there is hazy ground-glass attenuation. Given the distribution, this is most likely due to atelectasis.   Electronically Signed   By: Skipper Cliche M.D.   On: 05/19/2013 19:34    Microbiology: Recent Results (from the past 240 hour(s))  CULTURE, BLOOD (ROUTINE X 2)     Status: None   Collection Time    05/19/13  7:25 PM      Result Value Range Status   Specimen Description BLOOD RIGHT HAND   Final   Special Requests BOTTLES DRAWN AEROBIC AND ANAEROBIC 10CC   Final   Culture  Setup Time     Final   Value: 05/19/2013 22:58     Performed at [facility]   Culture     Final   Value:        BLOOD CULTURE RECEIVED NO GROWTH TO DATE CULTURE WILL BE HELD FOR 5 DAYS BEFORE ISSUING A FINAL NEGATIVE REPORT     Performed at [facility]   Report Status PENDING   Incomplete  CULTURE, BLOOD (ROUTINE X 2)     Status: None   Collection Time    05/19/13  7:40 PM      Result Value Range Status   Specimen Description BLOOD RIGHT ARM   Final   Special Requests BOTTLES DRAWN AEROBIC AND ANAEROBIC 10CC   Final   Culture  Setup Time     Final   Value: 05/19/2013 22:58     Performed at [facility]   Culture     Final   Value:        BLOOD CULTURE RECEIVED NO GROWTH TO DATE CULTURE WILL BE HELD FOR 5 DAYS BEFORE ISSUING A FINAL NEGATIVE REPORT     Performed at [facility]   Report Status PENDING   Incomplete     Labs: Basic Metabolic Panel:  Recent Labs Lab 05/19/13 1550 05/19/13 1940 05/20/13 0606  NA 141  --  140  K 3.3*   --  3.2*  CL 101  --  103  CO2 27  --  26  GLUCOSE 276*  --  132*  BUN 9  --  6  CREATININE 0.62 0.70 0.62  CALCIUM 8.9  --  8.2*   Liver Function Tests:  Recent Labs Lab 05/19/13 1550  AST 12  ALT 9  ALKPHOS 84  BILITOT 0.4  PROT 7.0  ALBUMIN 3.2*    Recent Labs Lab 05/19/13 1550  LIPASE 12   No results found for this basename: AMMONIA,  in the last 168 hours CBC:  Recent Labs Lab 05/19/13 1550 05/19/13 1940 05/20/13 0606  WBC 10.4 10.4 8.8  NEUTROABS 5.7  --   --   HGB 11.5* 10.8* 10.1*  HCT 35.1* 34.5* 32.2*  MCV 84.4 85.8 85.2  PLT 292 266 255   Cardiac Enzymes: No results found for this basename: CKTOTAL, CKMB, CKMBINDEX, TROPONINI,  in the last 168 hours BNP: BNP (last 3 results) No results found for this basename: PROBNP,  in the last 8760 hours CBG:  Recent Labs Lab 05/20/13 1159 05/20/13 1645 05/20/13 2106 05/21/13 0749 05/21/13 1252  GLUCAP 170* 257* 231* 155* 107*       Signed:  Jeter Tomey  [facility] 05/21/2013, 1:42 PM

## 2013-05-21 NOTE — Progress Notes (Signed)
Nutrition Brief Note  Patient identified on the Malnutrition Screening Tool (MST) Report  Wt Readings from Last 15 Encounters:  05/21/13 304 lb 7.3 oz (138.1 kg)  03/13/13 295 lb 8 oz (134.038 kg)    Body mass index is 46.3 kg/(m^2). Patient meets criteria for morbid obesity based on current BMI.   Current diet order is heart healthy, patient is consuming approximately 50% of meals at this time. Labs and medications reviewed.   Pt reported that she feels that her appetite has improved. She does not feel that she needs any type of nutritional supplementation at this time.  No nutrition interventions warranted at this time. If nutrition issues arise, please consult RD.   Terrace Arabia RD, LDN

## 2013-05-25 LAB — CULTURE, BLOOD (ROUTINE X 2)
CULTURE: NO GROWTH
Culture: NO GROWTH

## 2013-10-03 ENCOUNTER — Emergency Department (HOSPITAL_COMMUNITY)
Admission: EM | Admit: 2013-10-03 | Discharge: 2013-10-03 | Disposition: A | Discharge status: Home / Self Care | Payer: Medicare Other | Attending: Emergency Medicine | Admitting: Emergency Medicine

## 2013-10-03 ENCOUNTER — Encounter (HOSPITAL_COMMUNITY): Payer: Self-pay | Admitting: Emergency Medicine

## 2013-10-03 DIAGNOSIS — E669 Obesity, unspecified: Secondary | ICD-10-CM | POA: Insufficient documentation

## 2013-10-03 DIAGNOSIS — Z8709 Personal history of other diseases of the respiratory system: Secondary | ICD-10-CM | POA: Insufficient documentation

## 2013-10-03 DIAGNOSIS — K5900 Constipation, unspecified: Secondary | ICD-10-CM | POA: Insufficient documentation

## 2013-10-03 DIAGNOSIS — F17200 Nicotine dependence, unspecified, uncomplicated: Secondary | ICD-10-CM | POA: Insufficient documentation

## 2013-10-03 DIAGNOSIS — Z79899 Other long term (current) drug therapy: Secondary | ICD-10-CM | POA: Insufficient documentation

## 2013-10-03 DIAGNOSIS — E119 Type 2 diabetes mellitus without complications: Secondary | ICD-10-CM | POA: Insufficient documentation

## 2013-10-03 DIAGNOSIS — Z862 Personal history of diseases of the blood and blood-forming organs and certain disorders involving the immune mechanism: Secondary | ICD-10-CM | POA: Insufficient documentation

## 2013-10-03 DIAGNOSIS — Z792 Long term (current) use of antibiotics: Secondary | ICD-10-CM | POA: Insufficient documentation

## 2013-10-03 DIAGNOSIS — Z880 Allergy status to penicillin: Secondary | ICD-10-CM | POA: Insufficient documentation

## 2013-10-03 DIAGNOSIS — M549 Dorsalgia, unspecified: Secondary | ICD-10-CM

## 2013-10-03 DIAGNOSIS — G8921 Chronic pain due to trauma: Secondary | ICD-10-CM | POA: Insufficient documentation

## 2013-10-03 DIAGNOSIS — Z794 Long term (current) use of insulin: Secondary | ICD-10-CM | POA: Insufficient documentation

## 2013-10-03 DIAGNOSIS — M545 Low back pain, unspecified: Secondary | ICD-10-CM | POA: Insufficient documentation

## 2013-10-03 DIAGNOSIS — Z86718 Personal history of other venous thrombosis and embolism: Secondary | ICD-10-CM | POA: Insufficient documentation

## 2013-10-03 HISTORY — DX: Other chronic pain: G89.29

## 2013-10-03 HISTORY — DX: Obesity, unspecified: E66.9

## 2013-10-03 HISTORY — DX: Low back pain, unspecified: M54.50

## 2013-10-03 HISTORY — DX: Low back pain: M54.5

## 2013-10-03 MED ORDER — HYDROCODONE-ACETAMINOPHEN 5-325 MG PO TABS
2.0000 | ORAL_TABLET | Freq: Once | ORAL | Status: AC
  Administered 2013-10-03: 2 via ORAL
  Filled 2013-10-03: qty 2

## 2013-10-03 MED ORDER — HYDROCODONE-ACETAMINOPHEN 5-325 MG PO TABS: ORAL_TABLET | ORAL | Status: DC

## 2013-10-03 NOTE — ED Provider Notes (Signed)
CSN: [id number]     Arrival date & time 10/03/13  2052 History  This chart was scribed for non-physician practitioner, Carlisle Cater, PA-C, working with Orpah Greek, MD by Roe Coombs, ED Scribe. This patient was seen in room TR07C/TR07C and the patient's care was started at 9:40 PM.  Chief Complaint  Patient presents with  . Back Pain   The history is provided by the patient. No language interpreter was used.   HPI Comments: Anita Morgan is a 48 y.o. female with history of chronic back pain and right lower leg pain secondary to an injury in 2007 who presents to the Emergency Department complaining of severe, lower back pain and shooting right lower leg pain that worsened 4-5 days ago. Patient says that she always has pain, but she has frequent exacerbations. Pain is worse with sitting and most body movements. She has been taking ibuprofen and using heating pads without significant relief. Patient has taken Fentanyl in the past, but she says she doesn't like this due to side effects. She has taken multiple other narcotic pain medications and says that Vicodin usually helps improve her pain so that she is able to sleep. She has baseline weakness in her right leg, but she states that recently it has been "giving out" more than usual. She reports constipation - she states that sitting down to have a bowel movement has worsened her pain. She denies difficulty urinating, fever, nausea or vomiting. Patient states that she is from [place] and has been traveling back and forth from [place] to [place] to see her neurosurgeons. She has not been able to establish regular care here because her [employer] is not accepted at [facility]. She has a medical history of DM and her sugars have been in the 400s for the past several days which she attributes to the stress of pain. She says prior to her current pain episode her blood sugar averaged ~130.   Past Medical History  Diagnosis Date  .  Diabetes mellitus without complication   . Sickle cell anemia   . Bronchitis   . Ruptured lumbar disc   . PONV (postoperative nausea and vomiting)   . DVT (deep vein thrombosis) in pregnancy 2007    right leg  . Chronic low back pain   . Obesity    Past Surgical History  Procedure Laterality Date  . Cesarean section     Family History  Problem Relation Age of Onset  . Diabetes Mother    History  Substance Use Topics  . Smoking status: Current Every Day Smoker -- 0.75 packs/day for 16 years    Types: Cigarettes  . Smokeless tobacco: Never Used  . Alcohol Use: No   OB History   Grav Para Term Preterm Abortions TAB SAB Ect Mult Living   5 4 3 1 1  1         Review of Systems  Constitutional: Negative for fever and unexpected weight change.  Cardiovascular: Negative for leg swelling.  Gastrointestinal: Positive for constipation.       Negative for fecal incontinence.   Genitourinary: Negative for dysuria, hematuria, flank pain, vaginal bleeding, vaginal discharge, difficulty urinating and pelvic pain.       Negative for urinary incontinence or retention.  Musculoskeletal: Positive for back pain and myalgias (right lower leg pain). Negative for neck pain.  Neurological: Positive for weakness. Negative for numbness.       Denies saddle paresthesias.  Allergies  Bee venom; Influenza vaccines; Other; and Penicillins  Home Medications   Prior to Admission medications   Medication Sig Start Date End Date Taking? Authorizing Provider  diphenhydrAMINE (BENADRYL) 25 mg capsule Take 1 capsule (25 mg total) by mouth every 6 (six) hours as needed for itching. 02/15/13   Teressa Lower, MD  doxycycline (VIBRAMYCIN) 50 MG capsule Take 1 capsule (50 mg total) by mouth 2 (two) times daily. 05/21/13   Nishant Dhungel, MD  EPINEPHrine (EPIPEN) 0.3 mg/0.3 mL SOAJ injection Inject 0.3 mLs (0.3 mg total) into the muscle as needed. 02/15/13   Teressa Lower, MD  guaiFENesin (MUCINEX) 600 MG  12 hr tablet Take 600 mg by mouth 2 (two) times daily as needed for to loosen phlegm.    Historical Provider, MD  insulin aspart (NOVOLOG) 100 UNIT/ML injection Inject 2-6 Units into the skin 3 (three) times daily with meals. Sliding scale    Historical Provider, MD  insulin detemir (LEVEMIR) 100 UNIT/ML injection Inject 30-45 Units into the skin 2 (two) times daily. 45 units in the morning and 30 units in the evening.    Historical Provider, MD  Liraglutide 18 MG/3ML SOPN Inject 18 mg into the skin every evening.    Historical Provider, MD  oxyCODONE-acetaminophen (PERCOCET) 10-325 MG per tablet Take 1 tablet by mouth every 4 (four) hours as needed for pain.    Historical Provider, MD   Physical Exam  Nursing note and vitals reviewed. Constitutional: She appears well-developed and well-nourished. No distress.  HENT:  Head: Normocephalic and atraumatic.  Eyes: Conjunctivae and EOM are normal.  Neck: Normal range of motion. Neck supple. No tracheal deviation present.  Cardiovascular: Normal rate.   Pulmonary/Chest: Effort normal. No respiratory distress.  Abdominal: Soft. There is no tenderness. There is no CVA tenderness.  Musculoskeletal: Normal range of motion.       Cervical back: Normal.       Thoracic back: Normal.       Lumbar back: She exhibits tenderness. She exhibits normal range of motion and no bony tenderness.       Back:  No step-off noted with palpation of spine.   Neurological: She is alert. She has normal strength and normal reflexes. No sensory deficit.  5/5 strength in entire lower extremities bilaterally. No sensation deficit.   Skin: Skin is warm and dry. No rash noted.  Psychiatric: She has a normal mood and affect. Her behavior is normal.    ED Course  Procedures (including critical care time) DIAGNOSTIC STUDIES: Oxygen Saturation is 99% on room air, normal by my interpretation.    COORDINATION OF CARE: 9:51 PM- Patient informed of current plan for treatment  and evaluation and agrees with plan at this time.   BP 153/72  Pulse 97  Temp(Src) 98.1 F (36.7 C) (Oral)  Resp 20  Wt 300 lb 7 oz (136.278 kg)  SpO2 99%  LMP 09/13/2013     Labs Review Labs Reviewed - No data to display  Imaging Review No results found.   EKG Interpretation None       No red flag s/s of low back pain. Patient was counseled on back pain precautions and told to do activity as tolerated but do not lift, push, or pull heavy objects more than 10 pounds for the next week.  Patient counseled to use ice or heat on back for no longer than 15 minutes every hour.   Patient has muscle relaxer at home.   Patient prescribed  narcotic pain medicine and counseled on proper use of narcotic pain medications. Counseled not to combine this medication with others containing tylenol.   Urged patient not to drink alcohol, drive, or perform any other activities that requires focus while taking either of these medications.  Neurosurgery referral given.   Patient urged to follow-up with neurosurgeon if pain does not improve with treatment and rest or if pain becomes recurrent. Urged to return with worsening severe pain, loss of bowel or bladder control, trouble walking.   The patient verbalizes understanding and agrees with the plan.   MDM   Final diagnoses:  Back pain   Patient with back pain. No neurological deficits. Patient is ambulatory. No warning symptoms of back pain including: loss of bowel or bladder control, night sweats, waking from sleep with back pain, unexplained fevers or weight loss, h/o cancer, IVDU, recent trauma. No concern for cauda equina, epidural abscess, or other serious cause of back pain. Conservative measures such as rest, ice/heat and pain medicine indicated with neurosurgery follow-up if no improvement with conservative management.    I personally performed the services described in this documentation, which was scribed in my presence. The  recorded information has been reviewed and is accurate.    Carlisle Cater, PA-C 10/03/13 2338

## 2013-10-03 NOTE — Discharge Instructions (Signed)
Please read and follow all provided instructions.  Your diagnoses today include:  1. Back pain     Tests performed today include:  Vital signs - see below for your results today  Medications prescribed:   Vicodin (hydrocodone/acetaminophen) - narcotic pain medication  DO NOT drive or perform any activities that require you to be awake and alert because this medicine can make you drowsy. BE VERY CAREFUL not to take multiple medicines containing Tylenol (also called acetaminophen). Doing so can lead to an overdose which can damage your liver and cause liver failure and possibly death.  Take any prescribed medications only as directed.  Home care instructions:   Follow any educational materials contained in this packet  Please rest, use ice or heat on your back for the next several days  Do not lift, push, pull anything more than 10 pounds for the next week  Follow-up instructions: Please follow-up with your primary care provider in the next 1 week for further evaluation of your symptoms. You may also contact the neurosurgeon listed for further evaluation.    Return instructions:  SEEK IMMEDIATE MEDICAL ATTENTION IF YOU HAVE:  New numbness, tingling, weakness, or problem with the use of your arms or legs  Severe back pain not relieved with medications  Loss control of your bowels or bladder  Increasing pain in any areas of the body (such as chest or abdominal pain)  Shortness of breath, dizziness, or fainting.   Worsening nausea (feeling sick to your stomach), vomiting, fever, or sweats  Any other emergent concerns regarding your health   Additional Information:  Your vital signs today were: BP 153/72   Pulse 97   Temp(Src) 98.1 F (36.7 C) (Oral)   Resp 20   Wt 300 lb 7 oz (136.278 kg)   SpO2 99%   LMP 09/13/2013 If your blood pressure (BP) was elevated above 135/85 this visit, please have this repeated by your doctor within one month. --------------

## 2013-10-03 NOTE — ED Notes (Signed)
Pt was injured in 2007, steel rods from door went into back and right lower leg.  Pt seen surgeon in 2009 but he wouldn't do surgery d/t weight.  Pt has been seeing pain clinic in [place] but hasn't been able to go in past 2 months d/t cost of bus fare and unable to sit for that long.  For the past week pain has been unbearable and right leg has been giving out causing pt to fall.  Pain across lower back and down right leg.

## 2013-10-03 NOTE — ED Notes (Addendum)
Pt. requesting pain medication for her chronic low back pain since 2007 , pt. also reported that her left leg is " giving out " . Ambulatory. No recent fall or injury.

## 2013-10-03 NOTE — ED Notes (Signed)
Family member picking pt up 

## 2013-10-04 NOTE — ED Provider Notes (Signed)
Medical screening examination/treatment/procedure(s) were performed by non-physician practitioner and as supervising physician I was immediately available for consultation/collaboration.  Orpah Greek, MD 10/04/13 0110

## 2013-12-05 ENCOUNTER — Other Ambulatory Visit: Payer: Self-pay | Admitting: Sports Medicine

## 2013-12-05 DIAGNOSIS — M545 Low back pain: Secondary | ICD-10-CM

## 2013-12-08 ENCOUNTER — Encounter (HOSPITAL_COMMUNITY): Payer: Self-pay | Admitting: Emergency Medicine

## 2013-12-08 ENCOUNTER — Emergency Department (HOSPITAL_COMMUNITY)
Admission: EM | Admit: 2013-12-08 | Discharge: 2013-12-08 | Disposition: A | Discharge status: Home / Self Care | Payer: Medicare Other | Attending: Emergency Medicine | Admitting: Emergency Medicine

## 2013-12-08 ENCOUNTER — Emergency Department (HOSPITAL_COMMUNITY): Payer: Medicare Other

## 2013-12-08 DIAGNOSIS — M5380 Other specified dorsopathies, site unspecified: Secondary | ICD-10-CM | POA: Insufficient documentation

## 2013-12-08 DIAGNOSIS — Z880 Allergy status to penicillin: Secondary | ICD-10-CM | POA: Diagnosis not present

## 2013-12-08 DIAGNOSIS — Z8709 Personal history of other diseases of the respiratory system: Secondary | ICD-10-CM | POA: Insufficient documentation

## 2013-12-08 DIAGNOSIS — M545 Low back pain, unspecified: Secondary | ICD-10-CM | POA: Diagnosis present

## 2013-12-08 DIAGNOSIS — F17200 Nicotine dependence, unspecified, uncomplicated: Secondary | ICD-10-CM | POA: Diagnosis not present

## 2013-12-08 DIAGNOSIS — Z791 Long term (current) use of non-steroidal anti-inflammatories (NSAID): Secondary | ICD-10-CM | POA: Diagnosis not present

## 2013-12-08 DIAGNOSIS — Z862 Personal history of diseases of the blood and blood-forming organs and certain disorders involving the immune mechanism: Secondary | ICD-10-CM | POA: Insufficient documentation

## 2013-12-08 DIAGNOSIS — Z86718 Personal history of other venous thrombosis and embolism: Secondary | ICD-10-CM | POA: Diagnosis not present

## 2013-12-08 DIAGNOSIS — Z79899 Other long term (current) drug therapy: Secondary | ICD-10-CM | POA: Insufficient documentation

## 2013-12-08 DIAGNOSIS — E1169 Type 2 diabetes mellitus with other specified complication: Secondary | ICD-10-CM | POA: Insufficient documentation

## 2013-12-08 DIAGNOSIS — G8929 Other chronic pain: Secondary | ICD-10-CM | POA: Diagnosis not present

## 2013-12-08 DIAGNOSIS — E11628 Type 2 diabetes mellitus with other skin complications: Secondary | ICD-10-CM

## 2013-12-08 DIAGNOSIS — Z794 Long term (current) use of insulin: Secondary | ICD-10-CM | POA: Insufficient documentation

## 2013-12-08 DIAGNOSIS — M62830 Muscle spasm of back: Secondary | ICD-10-CM

## 2013-12-08 DIAGNOSIS — E669 Obesity, unspecified: Secondary | ICD-10-CM | POA: Diagnosis not present

## 2013-12-08 DIAGNOSIS — L089 Local infection of the skin and subcutaneous tissue, unspecified: Secondary | ICD-10-CM

## 2013-12-08 MED ORDER — CYCLOBENZAPRINE HCL 10 MG PO TABS: 10.0000 mg | ORAL_TABLET | Freq: Three times a day (TID) | ORAL | Status: AC | PRN

## 2013-12-08 MED ORDER — HYDROMORPHONE HCL PF 1 MG/ML IJ SOLN
1.0000 mg | Freq: Once | INTRAMUSCULAR | Status: AC
  Administered 2013-12-08: 1 mg via INTRAMUSCULAR
  Filled 2013-12-08: qty 1

## 2013-12-08 MED ORDER — OXYCODONE-ACETAMINOPHEN 5-325 MG PO TABS: 1.0000 | ORAL_TABLET | Freq: Four times a day (QID) | ORAL | Status: AC | PRN

## 2013-12-08 MED ORDER — SULFAMETHOXAZOLE-TMP DS 800-160 MG PO TABS: 1.0000 | ORAL_TABLET | Freq: Two times a day (BID) | ORAL | Status: AC

## 2013-12-08 MED ORDER — SULFAMETHOXAZOLE-TMP DS 800-160 MG PO TABS
1.0000 | ORAL_TABLET | Freq: Once | ORAL | Status: AC
  Administered 2013-12-08: 1 via ORAL
  Filled 2013-12-08: qty 1

## 2013-12-08 MED ORDER — CYCLOBENZAPRINE HCL 10 MG PO TABS
10.0000 mg | ORAL_TABLET | Freq: Once | ORAL | Status: AC
  Administered 2013-12-08: 10 mg via ORAL
  Filled 2013-12-08: qty 1

## 2013-12-08 MED ORDER — OXYCODONE-ACETAMINOPHEN 5-325 MG PO TABS
2.0000 | ORAL_TABLET | Freq: Once | ORAL | Status: AC
  Administered 2013-12-08: 2 via ORAL
  Filled 2013-12-08: qty 2

## 2013-12-08 NOTE — ED Provider Notes (Signed)
CSN: [id number]     Arrival date & time 12/08/13  9326 History   First MD Initiated Contact with Patient 12/08/13 [phone]     Chief Complaint  Patient presents with  . Foot Pain  . Back Pain     (Consider location/radiation/quality/duration/timing/severity/associated sxs/prior Treatment) HPI Comments: 48 year old female here 2 complaints Foot pain: No: Her foot earlier this week. He was much more swollen and draining pus. Has not been on antibiotics. Draining has improved in redness is gone down. Persistent pain despite improvement in the redness and drainage  Back pain: Work injury, is following workers, recommendations and has MRI scheduled for back pain. Worsening burning of her pain with radiation down her left posterior thigh. She thinks that she's been walking differently for her foot pain, so she might have caused more back pain.  Patient is a 48 y.o. female presenting with lower extremity pain and back pain. The history is provided by the patient.  Foot Pain This is a new problem. The current episode started more than 2 days ago. The problem occurs constantly. The problem has been gradually worsening. Pertinent negatives include no chest pain, no abdominal pain and no shortness of breath. Nothing aggravates the symptoms. Nothing relieves the symptoms.  Back Pain Location:  Lumbar spine Quality:  Aching and burning Radiates to:  L posterior upper leg Pain severity:  Moderate Pain is:  Same all the time Onset quality:  Gradual Timing:  Constant Progression:  Worsening Chronicity:  Chronic Context comment:  Work injury Relieved by:  Nothing Worsened by:  Sitting and ambulation Associated symptoms: no abdominal pain, no chest pain and no fever     Past Medical History  Diagnosis Date  . Diabetes mellitus without complication   . Sickle cell anemia   . Bronchitis   . Ruptured lumbar disc   . PONV (postoperative nausea and vomiting)   . DVT (deep vein thrombosis) in pregnancy  2007    right leg  . Chronic low back pain   . Obesity    Past Surgical History  Procedure Laterality Date  . Cesarean section     Family History  Problem Relation Age of Onset  . Diabetes Mother    History  Substance Use Topics  . Smoking status: Current Every Day Smoker -- 0.75 packs/day for 16 years    Types: Cigarettes  . Smokeless tobacco: Never Used  . Alcohol Use: No   OB History   Grav Para Term Preterm Abortions TAB SAB Ect Mult Living   5 4 3 1 1  1         Review of Systems  Constitutional: Negative for fever and chills.  Respiratory: Negative for cough and shortness of breath.   Cardiovascular: Negative for chest pain.  Gastrointestinal: Negative for vomiting and abdominal pain.  Musculoskeletal: Positive for back pain.  All other systems reviewed and are negative.     Allergies  Bee venom; Influenza vaccines; Other; and Penicillins  Home Medications   Prior to Admission medications   Medication Sig Start Date End Date Taking? Authorizing Provider  Canagliflozin (INVOKANA) 300 MG TABS Take 300 mg by mouth daily.    Historical Provider, MD  diphenhydrAMINE (BENADRYL) 25 mg capsule Take 1 capsule (25 mg total) by mouth every 6 (six) hours as needed for itching. 02/15/13   Teressa Lower, MD  EPINEPHrine (EPIPEN) 0.3 mg/0.3 mL SOAJ injection Inject 0.3 mLs (0.3 mg total) into the muscle as needed. 02/15/13   Teressa Lower, MD  HYDROcodone-acetaminophen (NORCO/VICODIN) 5-325 MG per tablet Take 1-2 tablets every 6 hours as needed for severe pain 10/03/13   Carlisle Cater, PA-C  ibuprofen (ADVIL,MOTRIN) 800 MG tablet Take 800 mg by mouth every 8 (eight) hours as needed.    Historical Provider, MD  insulin aspart (NOVOLOG) 100 UNIT/ML injection Inject 2-6 Units into the skin 3 (three) times daily with meals. Sliding scale    Historical Provider, MD  insulin detemir (LEVEMIR) 100 UNIT/ML injection Inject 45 Units into the skin every morning.    Historical Provider, MD   metFORMIN (GLUCOPHAGE) 500 MG tablet Take 500 mg by mouth 2 (two) times daily with a meal.    Historical Provider, MD   BP 146/78  Pulse 74  Temp(Src) 98.2 F (36.8 C) (Oral)  Resp 16  SpO2 100%  LMP 12/01/2013 Physical Exam  Nursing note and vitals reviewed. Constitutional: She is oriented to person, place, and time. She appears well-developed and well-nourished. No distress.  HENT:  Head: Normocephalic and atraumatic.  Mouth/Throat: Oropharynx is clear and moist.  Eyes: EOM are normal. Pupils are equal, round, and reactive to light.  Neck: Normal range of motion. Neck supple.  Cardiovascular: Normal rate and regular rhythm.  Exam reveals no friction rub.   No murmur heard. Pulmonary/Chest: Effort normal and breath sounds normal. No respiratory distress. She has no wheezes. She has no rales.  Abdominal: Soft. She exhibits no distension. There is no tenderness. There is no rebound.  Musculoskeletal: Normal range of motion. She exhibits no edema.  Neurological: She is alert and oriented to person, place, and time.  Skin: No rash noted. She is not diaphoretic.    ED Course  Procedures (including critical care time) Labs Review Labs Reviewed - No data to display  Imaging Review No results found.   EKG Interpretation None      MDM   Final diagnoses:  Diabetic infection of left foot  Back muscle spasm    48 year old female here with back pain and foot pain. She is a diabetic. Her story sounds like she had an infected foot ulcer. The redness and inflammation and drainage has stopped, but she is having persistent pain. She has chronic back pain that she is following with a doctor for workers comp. Has MRI scheduled in 4 days. Pain is worse over the past few days. She likely has been walking differently due to her foot pain and this caused more back pain. Denies urinary incontinence or retention. No fevers. States some tingling in that leg. With radiation down her posterior  thigh, this is consistent with sciatica. Given pain medicine, muscle relaxers. Will x-ray her foot to look for possible concern for osteomyelitis. She has no fevers or systemic symptoms of the discharge home on Bactrim if x-ray okay. Xray ok, feeling better. Sent home with pain meds, muscle relaxers, antibiotics. Instructed to f/u with PCP for her foot, MRI as scheduled for her back.  Evelina Bucy, MD 12/08/13 1023

## 2013-12-08 NOTE — ED Notes (Addendum)
Pt reports back pain x5 days, but has previous back injury pain 8/10. Pt also reports left foot/sole pain. Pt noticed a hole in heel of left foot 1 week ago, had drainage. At present wound appears heeled over. Hx of DM.

## 2013-12-08 NOTE — Discharge Instructions (Signed)
Diabetes and Foot Care Diabetes may cause you to have problems because of poor blood supply (circulation) to your feet and legs. This may cause the skin on your feet to become thinner, break easier, and heal more slowly. Your skin may become dry, and the skin may peel and crack. You may also have nerve damage in your legs and feet causing decreased feeling in them. You may not notice minor injuries to your feet that could lead to infections or more serious problems. Taking care of your feet is one of the most important things you can do for yourself.  HOME CARE INSTRUCTIONS  Wear shoes at all times, even in the house. Do not go barefoot. Bare feet are easily injured.  Check your feet daily for blisters, cuts, and redness. If you cannot see the bottom of your feet, use a mirror or ask someone for help.  Wash your feet with warm water (do not use hot water) and mild soap. Then pat your feet and the areas between your toes until they are completely dry. Do not soak your feet as this can dry your skin.  Apply a moisturizing lotion or petroleum jelly (that does not contain alcohol and is unscented) to the skin on your feet and to dry, brittle toenails. Do not apply lotion between your toes.  Trim your toenails straight across. Do not dig under them or around the cuticle. File the edges of your nails with an emery board or nail file.  Do not cut corns or calluses or try to remove them with medicine.  Wear clean socks or stockings every day. Make sure they are not too tight. Do not wear knee-high stockings since they may decrease blood flow to your legs.  Wear shoes that fit properly and have enough cushioning. To break in new shoes, wear them for just a few hours a day. This prevents you from injuring your feet. Always look in your shoes before you put them on to be sure there are no objects inside.  Do not cross your legs. This may decrease the blood flow to your feet.  If you find a minor scrape,  cut, or break in the skin on your feet, keep it and the skin around it clean and dry. These areas may be cleansed with mild soap and water. Do not cleanse the area with peroxide, alcohol, or iodine.  When you remove an adhesive bandage, be sure not to damage the skin around it.  If you have a wound, look at it several times a day to make sure it is healing.  Do not use heating pads or hot water bottles. They may burn your skin. If you have lost feeling in your feet or legs, you may not know it is happening until it is too late.  Make sure your health care provider performs a complete foot exam at least annually or more often if you have foot problems. Report any cuts, sores, or bruises to your health care provider immediately. SEEK MEDICAL CARE IF:   You have an injury that is not healing.  You have cuts or breaks in the skin.  You have an ingrown nail.  You notice redness on your legs or feet.  You feel burning or tingling in your legs or feet.  You have pain or cramps in your legs and feet.  Your legs or feet are numb.  Your feet always feel cold. SEEK IMMEDIATE MEDICAL CARE IF:   There is increasing redness,  swelling, or pain in or around a wound.  There is a red line that goes up your leg.  Pus is coming from a wound.  You develop a fever or as directed by your health care provider.  You notice a bad smell coming from an ulcer or wound. Document Released: 04/17/2000 Document Revised: 12/21/2012 Document Reviewed: 09/27/2012 [facility] Patient Information 2015 [place], [place]. This information is not intended to replace advice given to you by your health care provider. Make sure you discuss any questions you have with your health care provider.  Cellulitis Cellulitis is an infection of the skin and the tissue beneath it. The infected area is usually red and tender. Cellulitis occurs most often in the arms and lower legs.  CAUSES  Cellulitis is caused by bacteria that enter  the skin through cracks or cuts in the skin. The most common types of bacteria that cause cellulitis are staphylococci and streptococci. SIGNS AND SYMPTOMS   Redness and warmth.  Swelling.  Tenderness or pain.  Fever. DIAGNOSIS  Your health care provider can usually determine what is wrong based on a physical exam. Blood tests may also be done. TREATMENT  Treatment usually involves taking an antibiotic medicine. HOME CARE INSTRUCTIONS   Take your antibiotic medicine as directed by your health care provider. Finish the antibiotic even if you start to feel better.  Keep the infected arm or leg elevated to reduce swelling.  Apply a warm cloth to the affected area up to 4 times per day to relieve pain.  Take medicines only as directed by your health care provider.  Keep all follow-up visits as directed by your health care provider. SEEK MEDICAL CARE IF:   You notice red streaks coming from the infected area.  Your red area gets larger or turns dark in color.  Your bone or joint underneath the infected area becomes painful after the skin has healed.  Your infection returns in the same area or another area.  You notice a swollen bump in the infected area.  You develop new symptoms.  You have a fever. SEEK IMMEDIATE MEDICAL CARE IF:   You feel very sleepy.  You develop vomiting or diarrhea.  You have a general ill feeling (malaise) with muscle aches and pains. MAKE SURE YOU:   Understand these instructions.  Will watch your condition.  Will get help right away if you are not doing well or get worse. Document Released: 01/28/2005 Document Revised: 09/04/2013 Document Reviewed: 07/06/2011 [facility] Patient Information 2015 [place], [place]. This information is not intended to replace advice given to you by your health care provider. Make sure you discuss any questions you have with your health care provider.  Muscle Cramps and Spasms Muscle cramps and spasms occur when  a muscle or muscles tighten and you have no control over this tightening (involuntary muscle contraction). They are a common problem and can develop in any muscle. The most common place is in the calf muscles of the leg. Both muscle cramps and muscle spasms are involuntary muscle contractions, but they also have differences:   Muscle cramps are sporadic and painful. They may last a few seconds to a quarter of an hour. Muscle cramps are often more forceful and last longer than muscle spasms.  Muscle spasms may or may not be painful. They may also last just a few seconds or much longer. CAUSES  It is uncommon for cramps or spasms to be due to a serious underlying problem. In many cases, the  cause of cramps or spasms is unknown. Some common causes are:   Overexertion.   Overuse from repetitive motions (doing the same thing over and over).   Remaining in a certain position for a long period of time.   Improper preparation, form, or technique while performing a sport or activity.   Dehydration.   Injury.   Side effects of some medicines.   Abnormally low levels of the salts and ions in your blood (electrolytes), especially potassium and calcium. This could happen if you are taking water pills (diuretics) or you are pregnant.  Some underlying medical problems can make it more likely to develop cramps or spasms. These include, but are not limited to:   Diabetes.   Parkinson disease.   Hormone disorders, such as thyroid problems.   Alcohol abuse.   Diseases specific to muscles, joints, and bones.   Blood vessel disease where not enough blood is getting to the muscles.  HOME CARE INSTRUCTIONS   Stay well hydrated. Drink enough water and fluids to keep your urine clear or pale yellow.  It may be helpful to massage, stretch, and relax the affected muscle.  For tight or tense muscles, use a warm towel, heating pad, or hot shower water directed to the affected area.  If  you are sore or have pain after a cramp or spasm, applying ice to the affected area may relieve discomfort.  Put ice in a plastic bag.  Place a towel between your skin and the bag.  Leave the ice on for 15-20 minutes, 03-04 times a day.  Medicines used to treat a known cause of cramps or spasms may help reduce their frequency or severity. Only take over-the-counter or prescription medicines as directed by your caregiver. SEEK MEDICAL CARE IF:  Your cramps or spasms get more severe, more frequent, or do not improve over time.  MAKE SURE YOU:   Understand these instructions.  Will watch your condition.  Will get help right away if you are not doing well or get worse. Document Released: 10/10/2001 Document Revised: 08/15/2012 Document Reviewed: 04/06/2012 [facility] Patient Information 2015 [place], [place]. This information is not intended to replace advice given to you by your health care provider. Make sure you discuss any questions you have with your health care provider.

## 2013-12-12 ENCOUNTER — Other Ambulatory Visit: Payer: Medicare Other

## 2014-01-24 ENCOUNTER — Ambulatory Visit
Admission: RE | Admit: 2014-01-24 | Discharge: 2014-01-24 | Disposition: A | Discharge status: Home / Self Care | Payer: Medicare Other | Source: Ambulatory Visit | Attending: Sports Medicine | Admitting: Sports Medicine

## 2014-01-24 DIAGNOSIS — M545 Low back pain: Secondary | ICD-10-CM

## 2014-03-05 ENCOUNTER — Encounter (HOSPITAL_COMMUNITY): Payer: Self-pay | Admitting: Emergency Medicine

## 2014-04-26 ENCOUNTER — Emergency Department (HOSPITAL_COMMUNITY): Payer: Medicare Other

## 2014-04-26 ENCOUNTER — Emergency Department (HOSPITAL_COMMUNITY)
Admission: EM | Admit: 2014-04-26 | Discharge: 2014-04-26 | Disposition: A | Discharge status: Home / Self Care | Payer: Medicare Other | Attending: Emergency Medicine | Admitting: Emergency Medicine

## 2014-04-26 ENCOUNTER — Encounter (HOSPITAL_COMMUNITY): Payer: Self-pay | Admitting: Emergency Medicine

## 2014-04-26 DIAGNOSIS — J0100 Acute maxillary sinusitis, unspecified: Secondary | ICD-10-CM | POA: Diagnosis not present

## 2014-04-26 DIAGNOSIS — J069 Acute upper respiratory infection, unspecified: Secondary | ICD-10-CM | POA: Diagnosis not present

## 2014-04-26 DIAGNOSIS — D571 Sickle-cell disease without crisis: Secondary | ICD-10-CM | POA: Diagnosis not present

## 2014-04-26 DIAGNOSIS — R112 Nausea with vomiting, unspecified: Secondary | ICD-10-CM | POA: Insufficient documentation

## 2014-04-26 DIAGNOSIS — E876 Hypokalemia: Secondary | ICD-10-CM | POA: Diagnosis not present

## 2014-04-26 DIAGNOSIS — Z91040 Latex allergy status: Secondary | ICD-10-CM | POA: Insufficient documentation

## 2014-04-26 DIAGNOSIS — R42 Dizziness and giddiness: Secondary | ICD-10-CM | POA: Insufficient documentation

## 2014-04-26 DIAGNOSIS — E6609 Other obesity due to excess calories: Secondary | ICD-10-CM | POA: Insufficient documentation

## 2014-04-26 DIAGNOSIS — Z794 Long term (current) use of insulin: Secondary | ICD-10-CM | POA: Diagnosis not present

## 2014-04-26 DIAGNOSIS — E119 Type 2 diabetes mellitus without complications: Secondary | ICD-10-CM | POA: Insufficient documentation

## 2014-04-26 DIAGNOSIS — Z720 Tobacco use: Secondary | ICD-10-CM | POA: Insufficient documentation

## 2014-04-26 DIAGNOSIS — M545 Low back pain: Secondary | ICD-10-CM | POA: Insufficient documentation

## 2014-04-26 DIAGNOSIS — R197 Diarrhea, unspecified: Secondary | ICD-10-CM | POA: Diagnosis present

## 2014-04-26 DIAGNOSIS — Z86718 Personal history of other venous thrombosis and embolism: Secondary | ICD-10-CM | POA: Insufficient documentation

## 2014-04-26 DIAGNOSIS — I10 Essential (primary) hypertension: Secondary | ICD-10-CM | POA: Diagnosis not present

## 2014-04-26 DIAGNOSIS — R05 Cough: Secondary | ICD-10-CM

## 2014-04-26 DIAGNOSIS — R059 Cough, unspecified: Secondary | ICD-10-CM

## 2014-04-26 DIAGNOSIS — G8929 Other chronic pain: Secondary | ICD-10-CM | POA: Insufficient documentation

## 2014-04-26 DIAGNOSIS — Z79899 Other long term (current) drug therapy: Secondary | ICD-10-CM | POA: Diagnosis not present

## 2014-04-26 DIAGNOSIS — R0602 Shortness of breath: Secondary | ICD-10-CM

## 2014-04-26 DIAGNOSIS — R51 Headache: Secondary | ICD-10-CM

## 2014-04-26 DIAGNOSIS — Z880 Allergy status to penicillin: Secondary | ICD-10-CM | POA: Diagnosis not present

## 2014-04-26 DIAGNOSIS — M791 Myalgia: Secondary | ICD-10-CM | POA: Diagnosis not present

## 2014-04-26 DIAGNOSIS — R519 Headache, unspecified: Secondary | ICD-10-CM

## 2014-04-26 DIAGNOSIS — M549 Dorsalgia, unspecified: Secondary | ICD-10-CM

## 2014-04-26 HISTORY — DX: Essential (primary) hypertension: I10

## 2014-04-26 LAB — COMPREHENSIVE METABOLIC PANEL
ALBUMIN: 3.9 g/dL (ref 3.5–5.2)
ALT: 13 U/L (ref 0–35)
ANION GAP: 8 (ref 5–15)
AST: 18 U/L (ref 0–37)
Alkaline Phosphatase: 80 U/L (ref 39–117)
BILIRUBIN TOTAL: 0.6 mg/dL (ref 0.3–1.2)
BUN: 7 mg/dL (ref 6–23)
CO2: 24 mmol/L (ref 19–32)
CREATININE: 0.6 mg/dL (ref 0.50–1.10)
Calcium: 8.9 mg/dL (ref 8.4–10.5)
Chloride: 104 mEq/L (ref 96–112)
GFR calc Af Amer: >90 mL/min (ref >90)
GFR calc non Af Amer: >90 mL/min (ref >90)
Glucose, Bld: 238 mg/dL — ABNORMAL HIGH (ref 70–99)
Potassium: 3.2 mmol/L — ABNORMAL LOW (ref 3.5–5.1)
Sodium: 136 mmol/L (ref 135–145)
TOTAL PROTEIN: 7.5 g/dL (ref 6.0–8.3)

## 2014-04-26 LAB — URINALYSIS, ROUTINE W REFLEX MICROSCOPIC
Glucose, UA: 100 mg/dL — AB
Hgb urine dipstick: NEGATIVE
KETONES UR: NEGATIVE mg/dL
NITRITE: NEGATIVE
Protein, ur: 30 mg/dL — AB
SPECIFIC GRAVITY, URINE: 1.044 — AB (ref 1.005–1.030)
Urobilinogen, UA: 1 mg/dL (ref 0.0–1.0)
pH: 5.5 (ref 5.0–8.0)

## 2014-04-26 LAB — CBC WITH DIFFERENTIAL/PLATELET
Basophils Absolute: 0 10*3/uL (ref 0.0–0.1)
Basophils Relative: 0 % (ref 0–1)
EOS ABS: 0.2 10*3/uL (ref 0.0–0.7)
EOS PCT: 1 % (ref 0–5)
HEMATOCRIT: 38.5 % (ref 36.0–46.0)
HEMOGLOBIN: 12.1 g/dL (ref 12.0–15.0)
Lymphocytes Relative: 38 % (ref 12–46)
Lymphs Abs: 4.6 10*3/uL — ABNORMAL HIGH (ref 0.7–4.0)
MCH: 27 pg (ref 26.0–34.0)
MCHC: 31.4 g/dL (ref 30.0–36.0)
MCV: 85.9 fL (ref 78.0–100.0)
MONO ABS: 0.7 10*3/uL (ref 0.1–1.0)
Monocytes Relative: 6 % (ref 3–12)
Neutro Abs: 6.7 10*3/uL (ref 1.7–7.7)
Neutrophils Relative %: 55 % (ref 43–77)
Platelets: 299 10*3/uL (ref 150–400)
RBC: 4.48 MIL/uL (ref 3.87–5.11)
RDW: 13.6 % (ref 11.5–15.5)
WBC: 12.1 10*3/uL — ABNORMAL HIGH (ref 4.0–10.5)

## 2014-04-26 LAB — URINE MICROSCOPIC-ADD ON

## 2014-04-26 LAB — RETICULOCYTES
RBC.: 4.5 MIL/uL (ref 3.87–5.11)
RETIC COUNT ABSOLUTE: 112.5 10*3/uL (ref 19.0–186.0)
Retic Ct Pct: 2.5 % (ref 0.4–3.1)

## 2014-04-26 MED ORDER — KETOROLAC TROMETHAMINE 30 MG/ML IJ SOLN
30.0000 mg | Freq: Once | INTRAMUSCULAR | Status: AC
  Administered 2014-04-26: 30 mg via INTRAVENOUS
  Filled 2014-04-26: qty 1

## 2014-04-26 MED ORDER — OXYCODONE-ACETAMINOPHEN 5-325 MG PO TABS: 1.0000 | ORAL_TABLET | Freq: Four times a day (QID) | ORAL | Status: AC | PRN

## 2014-04-26 MED ORDER — FLUTICASONE PROPIONATE 50 MCG/ACT NA SUSP: 2.0000 | Freq: Every day | NASAL | Status: AC

## 2014-04-26 MED ORDER — POTASSIUM CHLORIDE CRYS ER 20 MEQ PO TBCR: 20.0000 meq | EXTENDED_RELEASE_TABLET | Freq: Every day | ORAL | Status: AC

## 2014-04-26 MED ORDER — OXYCODONE-ACETAMINOPHEN 5-325 MG PO TABS
1.0000 | ORAL_TABLET | Freq: Once | ORAL | Status: AC
  Administered 2014-04-26: 1 via ORAL
  Filled 2014-04-26: qty 1

## 2014-04-26 MED ORDER — AZITHROMYCIN 250 MG PO TABS: ORAL_TABLET | ORAL | Status: AC

## 2014-04-26 MED ORDER — DIPHENHYDRAMINE HCL 25 MG PO TABS: 25.0000 mg | ORAL_TABLET | ORAL | Status: AC | PRN

## 2014-04-26 MED ORDER — METOCLOPRAMIDE HCL 10 MG PO TABS: 10.0000 mg | ORAL_TABLET | Freq: Four times a day (QID) | ORAL | Status: AC | PRN

## 2014-04-26 MED ORDER — NAPROXEN 500 MG PO TABS: 500.0000 mg | ORAL_TABLET | Freq: Two times a day (BID) | ORAL | Status: AC | PRN

## 2014-04-26 MED ORDER — DIPHENHYDRAMINE HCL 50 MG/ML IJ SOLN
25.0000 mg | Freq: Once | INTRAMUSCULAR | Status: AC
  Administered 2014-04-26: 25 mg via INTRAVENOUS
  Filled 2014-04-26: qty 1

## 2014-04-26 MED ORDER — LOSARTAN POTASSIUM 25 MG PO TABS: 25.0000 mg | ORAL_TABLET | Freq: Every day | ORAL | Status: AC

## 2014-04-26 MED ORDER — METOCLOPRAMIDE HCL 5 MG/ML IJ SOLN
10.0000 mg | Freq: Once | INTRAMUSCULAR | Status: AC
  Administered 2014-04-26: 10 mg via INTRAVENOUS
  Filled 2014-04-26: qty 2

## 2014-04-26 NOTE — ED Notes (Signed)
Pt c/o headache, cold like symptoms, diarrhea, lower back pain x 5 days.

## 2014-04-26 NOTE — Discharge Instructions (Signed)
Continue to stay well-hydrated. Gargle warm salt water and spit it out. Continue to alternate between naprosyn and percocet as directed as needed for pain or fever, or headaches/back pain. Use Mucinex for cough suppression/expectoration of mucus. Use netipot and flonase to help with nasal congestion. May consider over-the-counter Benadryl or other antihistamine to decrease secretions and for watery itchy eyes. Use reglan and benadryl together as directed as needed for nausea or headache. Start the azithromycin ONLY IF your symptoms worsen (develop fevers or increased symptoms), otherwise if your symptoms don't worsen and they gradually improve do NOT take the antibiotic. Followup with your primary care doctor in 5-7 days for recheck of ongoing symptoms. Return to emergency department for emergent changing or worsening of symptoms.   Sinusitis Sinusitis is redness, soreness, and puffiness (inflammation) of the air pockets in the bones of your face (sinuses). The redness, soreness, and puffiness can cause air and mucus to get trapped in your sinuses. This can allow germs to grow and cause an infection.  HOME CARE   Drink enough fluids to keep your pee (urine) clear or pale yellow.  Use a humidifier in your home.  Run a hot shower to create steam in the bathroom. Sit in the bathroom with the door closed. Breathe in the steam 3-4 times a day.  Put a warm, moist washcloth on your face 3-4 times a day, or as told by your doctor.  Use salt water sprays (saline sprays) to wet the thick fluid in your nose. This can help the sinuses drain.  Only take medicine as told by your doctor. GET HELP RIGHT AWAY IF:   Your pain gets worse.  You have very bad headaches.  You are sick to your stomach (nauseous).  You throw up (vomit).  You are very sleepy (drowsy) all the time.  Your face is puffy (swollen).  Your vision changes.  You have a stiff neck.  You have trouble breathing. MAKE SURE YOU:    Understand these instructions.  Will watch your condition.  Will get help right away if you are not doing well or get worse. Document Released: 10/07/2007 Document Revised: 01/13/2012 Document Reviewed: 11/24/2011 [facility] Patient Information 2015 [place], [place]. This information is not intended to replace advice given to you by your health care provider. Make sure you discuss any questions you have with your health care provider.  Sinus Headache A sinus headache happens when your sinuses become clogged or puffy (swollen). Sinus headaches can be mild or severe. HOME CARE  Take your medicines (antibiotics) as told. Finish them even if you start to feel better.  Only take medicine as told by your doctor.  Use a nose spray if you feel stuffed up (congested). GET HELP RIGHT AWAY IF:  You have a fever.  You have trouble seeing.  You suddenly have pain in your face or head.  You start to twitch or shake (seizure).  You are confused.  You get headaches more than once a week.  Light or sound bothers you.  You feel sick to your stomach (nauseous) or throw up (vomit).  Your headaches do not get better with treatment. MAKE SURE YOU:  Understand these instructions.  Will watch your condition.  Will get help right away if you are not doing well or get worse. Document Released: 08/20/2010 Document Revised: 07/13/2011 Document Reviewed: 08/20/2010 [facility] Patient Information 2015 [place], [place]. This information is not intended to replace advice given to you by your health care provider. Make sure  you discuss any questions you have with your health care provider.  Nausea and Vomiting Nausea means you feel sick to your stomach. Throwing up (vomiting) is a reflex where stomach contents come out of your mouth. HOME CARE   Take medicine as told by your doctor.  Do not force yourself to eat. However, you do need to drink fluids.  If you feel like eating, eat a normal diet as  told by your doctor.  Eat rice, wheat, potatoes, bread, lean meats, yogurt, fruits, and vegetables.  Avoid high-fat foods.  Drink enough fluids to keep your pee (urine) clear or pale yellow.  Ask your doctor how to replace body fluid losses (rehydrate). Signs of body fluid loss (dehydration) include:  Feeling very thirsty.  Dry lips and mouth.  Feeling dizzy.  Dark pee.  Peeing less than normal.  Feeling confused.  Fast breathing or heart rate. GET HELP RIGHT AWAY IF:   You have blood in your throw up.  You have black or bloody poop (stool).  You have a bad headache or stiff neck.  You feel confused.  You have bad belly (abdominal) pain.  You have chest pain or trouble breathing.  You do not pee at least once every 8 hours.  You have cold, clammy skin.  You keep throwing up after 24 to 48 hours.  You have a fever. MAKE SURE YOU:   Understand these instructions.  Will watch your condition.  Will get help right away if you are not doing well or get worse. Document Released: 10/07/2007 Document Revised: 07/13/2011 Document Reviewed: 09/19/2010 [facility] Patient Information 2015 [place], [place]. This information is not intended to replace advice given to you by your health care provider. Make sure you discuss any questions you have with your health care provider.  Chronic Back Pain  When back pain lasts longer than 3 months, it is called chronic back pain.People with chronic back pain often go through certain periods that are more intense (flare-ups).  CAUSES Chronic back pain can be caused by wear and tear (degeneration) on different structures in your back. These structures include:  The bones of your spine (vertebrae) and the joints surrounding your spinal cord and nerve roots (facets).  The strong, fibrous tissues that connect your vertebrae (ligaments). Degeneration of these structures may result in pressure on your nerves. This can lead to constant  pain. HOME CARE INSTRUCTIONS  Avoid bending, heavy lifting, prolonged sitting, and activities which make the problem worse.  Take brief periods of rest throughout the day to reduce your pain. Lying down or standing usually is better than sitting while you are resting.  Take over-the-counter or prescription medicines only as directed by your caregiver. SEEK IMMEDIATE MEDICAL CARE IF:   You have weakness or numbness in one of your legs or feet.  You have trouble controlling your bladder or bowels.  You have nausea, vomiting, abdominal pain, shortness of breath, or fainting. Document Released: 05/28/2004 Document Revised: 07/13/2011 Document Reviewed: 04/04/2011 [facility] Patient Information 2015 [place], [place]. This information is not intended to replace advice given to you by your health care provider. Make sure you discuss any questions you have with your health care provider.

## 2014-04-26 NOTE — ED Notes (Signed)
Pt was able to keep fluids down.  

## 2014-04-26 NOTE — ED Provider Notes (Signed)
CSN: [id number]     Arrival date & time 04/26/14  1656 History   First MD Initiated Contact with Patient 04/26/14 1717     Chief Complaint  Patient presents with  . Headache  . Diarrhea  . Back Pain     (Consider location/radiation/quality/duration/timing/severity/associated sxs/prior Treatment) HPI Comments: Anita Morgan is a 48 y.o. female with a PMHx of DM2, Sickle cell anemia, ruptured lumbar disc, chronic back pain, obesity, HTN, and remote DVT after pregnancy, who presents to the ED with complaints of URI symptoms x1.5 weeks. Symptoms include sinus congestion, yellow rhinorrhea, frontal HA, dry cough, postnasal drip, lightheadedness with standing, NBNB n/v (~10 episodes in last 24hrs), watery loose diarrhea (~6-8 episodes in 24hrs), intermittent SOB, and generalized body aches. +Sick contacts. Reports the HA is gradual onset 7/10 frontal pressure nonradiating, constant, worse with lights, and improved with ibuprofen, alka seltzer cold and flu, and claritin. States this is similar to prior HAs in the past, feels like her sinuses are causing her to have the headache. Overall her URI symptoms are improving but the sinus pressure and HA have persisted. She also complains of chronic back pain which is now slightly worse, with chronic L posterior leg paresthesias. She has had this in the past. Reports subjective fever of 101F two days ago but none since then. Denies ongoing fevers, chills, sore throat, tinnitus, ear pain/drainage, eye pain/drainage, vision changes, photophobia, CP, claudication, LE swelling, hemoptysis, abd pain, constipation, melena, hematochezia, hematuria, dysuria, arthralgias, focal weakness, new paresthesias, or rashes. Denies hx of CA or IVDU.   Patient is a 48 y.o. female presenting with URI. The history is provided by the patient. No language interpreter was used.  URI Presenting symptoms: congestion, cough (dry), fever (now resolved) and rhinorrhea   Presenting symptoms: no  ear pain and no sore throat   Severity:  Moderate Onset quality:  Gradual Duration:  2 weeks Timing:  Constant Progression:  Unchanged Chronicity:  New Relieved by:  Decongestant, OTC medications and rest Worsened by:  Nothing tried Ineffective treatments:  None tried Associated symptoms: headaches, myalgias (generalized body aches) and sinus pain   Associated symptoms: no arthralgias, no neck pain, no swollen glands and no wheezing   Risk factors: diabetes mellitus and sick contacts   Risk factors: no recent travel     Past Medical History  Diagnosis Date  . Diabetes mellitus without complication   . Sickle cell anemia   . Bronchitis   . Ruptured lumbar disc   . PONV (postoperative nausea and vomiting)   . DVT (deep vein thrombosis) in pregnancy 2007    right leg  . Chronic low back pain   . Obesity   . Hypertension    Past Surgical History  Procedure Laterality Date  . Cesarean section     Family History  Problem Relation Age of Onset  . Diabetes Mother    History  Substance Use Topics  . Smoking status: Current Every Day Smoker -- 0.75 packs/day for 16 years    Types: Cigarettes  . Smokeless tobacco: Never Used  . Alcohol Use: No   OB History    Gravida Para Term Preterm AB TAB SAB Ectopic Multiple Living   5 4 3 1 1  1         Review of Systems  Constitutional: Positive for fever (now resolved). Negative for chills.  HENT: Positive for congestion, postnasal drip, rhinorrhea and sinus pressure. Negative for ear discharge, ear pain, sore throat, tinnitus and trouble  swallowing.   Eyes: Negative for photophobia, pain, discharge, redness and visual disturbance.  Respiratory: Positive for cough (dry) and shortness of breath (intermittent). Negative for chest tightness and wheezing.   Cardiovascular: Negative for chest pain, palpitations and leg swelling.  Gastrointestinal: Positive for nausea, vomiting and diarrhea. Negative for abdominal pain and constipation.    Genitourinary: Negative for dysuria and hematuria.  Musculoskeletal: Positive for myalgias (generalized body aches) and back pain (lumbar). Negative for arthralgias, gait problem and neck pain.  Skin: Negative for rash.  Neurological: Positive for light-headedness and headaches. Negative for syncope, weakness and numbness.  Psychiatric/Behavioral: Negative for confusion.   10 Systems reviewed and are negative for acute change except as noted in the HPI.    Allergies  Bee venom; Influenza vaccines; Other; Penicillins; and Latex  Home Medications   Prior to Admission medications   Medication Sig Start Date End Date Taking? Authorizing Provider  aspirin EC 81 MG tablet Take 81 mg by mouth daily.   Yes Historical Provider, MD  Canagliflozin (INVOKANA) 300 MG TABS Take 300 mg by mouth every morning.    Yes Historical Provider, MD  Chlorphen-Phenyleph-ASA (ALKA-SELTZER PLUS COLD PO) Take 1 tablet by mouth every 6 (six) hours as needed (allergies.).   Yes Historical Provider, MD  cyclobenzaprine (FLEXERIL) 10 MG tablet Take 1 tablet (10 mg total) by mouth 3 (three) times daily as needed for muscle spasms. 12/08/13  Yes Evelina Bucy, MD  diphenhydrAMINE (BENADRYL) 25 mg capsule Take 1 capsule (25 mg total) by mouth every 6 (six) hours as needed for itching. 02/15/13  Yes Teressa Lower, MD  EPINEPHrine (EPIPEN) 0.3 mg/0.3 mL SOAJ injection Inject 0.3 mLs (0.3 mg total) into the muscle as needed. 02/15/13  Yes Teressa Lower, MD  ferrous sulfate 325 (65 FE) MG tablet Take 325 mg by mouth daily with breakfast.   Yes Historical Provider, MD  ibuprofen (ADVIL,MOTRIN) 800 MG tablet Take 800 mg by mouth every 8 (eight) hours as needed for moderate pain.    Yes Historical Provider, MD  insulin detemir (LEVEMIR) 100 UNIT/ML injection Inject 45 Units into the skin every morning.   Yes Historical Provider, MD  metaxalone (SKELAXIN) 800 MG tablet Take 800 mg by mouth 4 (four) times daily as needed for muscle spasms.    Yes Historical Provider, MD  oxyCODONE-acetaminophen (PERCOCET/ROXICET) 5-325 MG per tablet Take 1 tablet by mouth every 6 (six) hours as needed for severe pain. Patient not taking: Reported on 04/26/2014 12/08/13   Evelina Bucy, MD  sulfamethoxazole-trimethoprim (BACTRIM DS) 800-160 MG per tablet Take 1 tablet by mouth 2 (two) times daily. Patient not taking: Reported on 04/26/2014 12/08/13   Evelina Bucy, MD   BP 149/83 mmHg  Pulse 89  Temp(Src) 97.8 F (36.6 C) (Oral)  Resp 20  SpO2 99% Physical Exam  Constitutional: She is oriented to person, place, and time. Vital signs are normal. She appears well-developed and well-nourished.  Non-toxic appearance. No distress.  Afebrile, nontoxic, NAD, obese AAF  HENT:  Head: Normocephalic and atraumatic.  Right Ear: Hearing, tympanic membrane, external ear and ear canal normal.  Left Ear: Hearing, tympanic membrane, external ear and ear canal normal.  Nose: Mucosal edema and rhinorrhea present. Right sinus exhibits maxillary sinus tenderness. Right sinus exhibits no frontal sinus tenderness. Left sinus exhibits maxillary sinus tenderness. Left sinus exhibits no frontal sinus tenderness.  Mouth/Throat: Uvula is midline and oropharynx is clear and moist. Mucous membranes are dry (mildly). No trismus in the jaw. No uvula  swelling.  B/l maxillary sinus TTP Nasal turbinates edematous and erythematous with mild rhinorrhea Mildly dry mucous membranes Ears clear bilaterally  Eyes: Conjunctivae and EOM are normal. Pupils are equal, round, and reactive to light. Right eye exhibits no discharge. Left eye exhibits no discharge.  PERRL, EOMI  Neck: Normal range of motion. Neck supple. No spinous process tenderness and no muscular tenderness present. No rigidity. No edema, no erythema and normal range of motion present.  FROM intact without spinous process or paraspinous muscle TTP, no bony stepoffs or deformities, no muscle spasms. No rigidity or meningeal  signs. No bruising or swelling.   Cardiovascular: Normal rate, regular rhythm, normal heart sounds and intact distal pulses.  Exam reveals no gallop and no friction rub.   No murmur heard. Pulmonary/Chest: Effort normal and breath sounds normal. No respiratory distress. She has no decreased breath sounds. She has no wheezes. She has no rhonchi. She has no rales.  CTAB in all lung fields, dry cough intermittently, SpO2 99% on RA, speaking in full sentences  Abdominal: Soft. Normal appearance and bowel sounds are normal. She exhibits no distension. There is no tenderness. There is no rigidity, no rebound, no guarding, no CVA tenderness, no tenderness at McBurney's point and negative Murphy's sign.  Obese abdomen which limits exam. Soft, NTND, +BS throughout, no r/g/r, neg murphy's, neg mcburney's, no CVA TTP  Musculoskeletal: Normal range of motion.       Lumbar back: She exhibits tenderness. She exhibits normal range of motion.       Back:  All spinal levels with FROM intact  Mild lumbar spinous process and b/l paraspinous muscle TTP, no bony stepoffs or deformities, no muscle spasms. No bruising or swelling. Neg SLR bilaterally. Strength 5/5 in all extremities, sensation grossly intact in all extremities, gait steady  Lymphadenopathy:       Head (right side): Tonsillar adenopathy present.       Head (left side): Tonsillar adenopathy present.    She has cervical adenopathy.  Shotty anterior and tonsillar LAD bilaterally, mildly TTP on R side  Neurological: She is alert and oriented to person, place, and time. She has normal strength. No cranial nerve deficit or sensory deficit. Gait normal.  CN 2-12 grossly intact A&O x4 GCS 15 Sensation and strength intact Gait nonataxic  Skin: Skin is warm, dry and intact. No rash noted.  Psychiatric: She has a normal mood and affect.  Nursing note and vitals reviewed.   ED Course  Procedures (including critical care time) Labs Review Labs Reviewed    CBC WITH DIFFERENTIAL - Abnormal; Notable for the following:    WBC 12.1 (*)    Lymphs Abs 4.6 (*)    All other components within normal limits  COMPREHENSIVE METABOLIC PANEL - Abnormal; Notable for the following:    Potassium 3.2 (*)    Glucose, Bld 238 (*)    All other components within normal limits  URINALYSIS, ROUTINE W REFLEX MICROSCOPIC - Abnormal; Notable for the following:    Color, Urine AMBER (*)    APPearance CLOUDY (*)    Specific Gravity, Urine 1.044 (*)    Glucose, UA 100 (*)    Bilirubin Urine SMALL (*)    Protein, ur 30 (*)    Leukocytes, UA SMALL (*)    All other components within normal limits  URINE MICROSCOPIC-ADD ON - Abnormal; Notable for the following:    Squamous Epithelial / LPF MANY (*)    Casts HYALINE CASTS (*)  All other components within normal limits  RETICULOCYTES    Imaging Review Dg Chest 2 View  04/26/2014   CLINICAL DATA:  Congestion, cough  EXAM: CHEST  2 VIEW  COMPARISON:  05/19/2013  FINDINGS: Cardiac shadow is within normal limits. The lungs are well aerated bilaterally. No focal infiltrate or sizable effusion is seen. No bony abnormality is noted.  IMPRESSION: No acute abnormality seen.   Electronically Signed   By: Inez Catalina M.D.   On: 04/26/2014 18:09     EKG Interpretation None      MDM   Final diagnoses:  Cough  Hypokalemia  Severe obesity (BMI >= 40)  Chronic back pain  URI (upper respiratory infection)  Acute maxillary sinusitis, recurrence not specified  Sinus headache  Non-intractable vomiting with nausea, vomiting of unspecified type  SOB (shortness of breath)  Type 2 diabetes mellitus without complication  Diarrhea    48 y.o. female with URI symptoms and HA. Has hx of lumbar back pain, states she feels a swollen area which has occurred in the past, exam unable to reveal swollen area. No erythema or warmth. No red flag s/sx of back pain, doubt need for emergent imaging, and pt had MRI in sept. Will obtain CXR  to r/o any infiltrates given that pt had intermittent SOB and has hx of sickle cell. Pt will get fluids and migraine cocktail, and percocet (pt doesn't want morphine), and reassess. Doubt need for head imaging at this time.   6:35 PM Symptoms overall improved. CBC w/diff showing mildly elevated WBC at 12.1 without any other abnormalities. CMP showing chronic hypokalemia at 3.2, and hyperglycemia at 238. U/A pending. CXR without any acute findings, doubt acute chest. Will await U/A then plan for d/c with symptomatic control for likely URI. Discussed w/ pt that given the length of her symptoms, this could still be viral but potentially could be bacterial sinusitis, given that she doesn't have close PCP f/up (in between PCPs at this time) will give safety script of abx. If her symptoms worsen then she can start the antibiotic, if unchanged then continue with symptom control (flonase, netipot, mucinex, etc). Will give small supply of pain control for chronic back pain but encouraged pt to seek chronic pain management again. Of note, pt wants her HTN meds, says she takes Cozaar 25mg . Not found in system but pt has documentation that shows this is her med. Will give refill of this.  7:47 PM U/A resulted with very contaminated specimen, no ketones, doubt DKA. Will send for culture but doubt UTI, likely contamination and dehydration. Tolerating PO well. Will d/c with prior plan. I explained the diagnosis and have given explicit precautions to return to the ER including for any other new or worsening symptoms. The patient understands and accepts the medical plan as it's been dictated and I have answered their questions. Discharge instructions concerning home care and prescriptions have been given. The patient is STABLE and is discharged to home in good condition.  BP 139/78 mmHg  Pulse 86  Temp(Src) 98 F (36.7 C) (Oral)  Resp 18  SpO2 98%  LMP 04/09/2014  Meds ordered this encounter  Medications  .  Chlorphen-Phenyleph-ASA (ALKA-SELTZER PLUS COLD PO)    Sig: Take 1 tablet by mouth every 6 (six) hours as needed (allergies.).  Marland Kitchen aspirin EC 81 MG tablet    Sig: Take 81 mg by mouth daily.  . metoCLOPramide (REGLAN) injection 10 mg    Sig:    And  .  diphenhydrAMINE (BENADRYL) injection 25 mg    Sig:    And  . ketorolac (TORADOL) 30 MG/ML injection 30 mg    Sig:   . oxyCODONE-acetaminophen (PERCOCET/ROXICET) 5-325 MG per tablet 1 tablet    Sig:   . fluticasone (FLONASE) 50 MCG/ACT nasal spray    Sig: Place 2 sprays into both nostrils daily.    Dispense:  16 g    Refill:  0    Order Specific Question:  Supervising Provider    Answer:  Noemi Chapel D [[id number]]  . diphenhydrAMINE (BENADRYL) 25 MG tablet    Sig: Take 1 tablet (25 mg total) by mouth every 4 (four) hours as needed (headache).    Dispense:  20 tablet    Refill:  0    Order Specific Question:  Supervising Provider    Answer:  Noemi Chapel D [[id number]]  . naproxen (NAPROSYN) 500 MG tablet    Sig: Take 1 tablet (500 mg total) by mouth 2 (two) times daily as needed for mild pain, moderate pain or headache (TAKE WITH MEALS.).    Dispense:  20 tablet    Refill:  0    Order Specific Question:  Supervising Provider    Answer:  Noemi Chapel D [[id number]]  . oxyCODONE-acetaminophen (PERCOCET) 5-325 MG per tablet    Sig: Take 1-2 tablets by mouth every 6 (six) hours as needed for severe pain.    Dispense:  10 tablet    Refill:  0    Order Specific Question:  Supervising Provider    Answer:  Noemi Chapel D [[id number]]  . azithromycin (ZITHROMAX Z-PAK) 250 MG tablet    Sig: 2 po day one, then 1 daily x 4 days. START ONLY IF SYMPTOMS WORSEN    Dispense:  5 tablet    Refill:  0    Order Specific Question:  Supervising Provider    Answer:  Noemi Chapel D [[id number]]  . metoCLOPramide (REGLAN) 10 MG tablet    Sig: Take 1 tablet (10 mg total) by mouth every 6 (six) hours as needed for nausea (nausea/headache).    Dispense:  6 tablet    Refill:   0    Order Specific Question:  Supervising Provider    Answer:  Noemi Chapel D [[id number]]  . losartan (COZAAR) 25 MG tablet    Sig: Take 1 tablet (25 mg total) by mouth daily.    Dispense:  30 tablet    Refill:  0    Order Specific Question:  Supervising Provider    Answer:  Noemi Chapel D [[id number]]  . potassium chloride SA (K-DUR,KLOR-CON) 20 MEQ tablet    Sig: Take 1 tablet (20 mEq total) by mouth daily.    Dispense:  3 tablet    Refill:  0    Order Specific Question:  Supervising Provider    Answer:  Johnna Acosta [address] [place], PA-C 04/26/14 1948  Carmin Muskrat, MD 04/26/14 2103

## 2014-04-28 LAB — URINE CULTURE
Colony Count: 25000
SPECIAL REQUESTS: NORMAL

## 2014-11-03 ENCOUNTER — Encounter (HOSPITAL_COMMUNITY): Payer: Self-pay | Admitting: Emergency Medicine

## 2014-11-03 ENCOUNTER — Emergency Department (HOSPITAL_COMMUNITY)
Admission: EM | Admit: 2014-11-03 | Discharge: 2014-11-03 | Disposition: A | Discharge status: Home / Self Care | Payer: Medicare Other | Attending: Emergency Medicine | Admitting: Emergency Medicine

## 2014-11-03 ENCOUNTER — Emergency Department (HOSPITAL_COMMUNITY): Payer: Medicare Other

## 2014-11-03 DIAGNOSIS — Z7951 Long term (current) use of inhaled steroids: Secondary | ICD-10-CM | POA: Insufficient documentation

## 2014-11-03 DIAGNOSIS — Z880 Allergy status to penicillin: Secondary | ICD-10-CM | POA: Diagnosis not present

## 2014-11-03 DIAGNOSIS — Z86718 Personal history of other venous thrombosis and embolism: Secondary | ICD-10-CM | POA: Diagnosis not present

## 2014-11-03 DIAGNOSIS — I10 Essential (primary) hypertension: Secondary | ICD-10-CM | POA: Diagnosis not present

## 2014-11-03 DIAGNOSIS — S3992XA Unspecified injury of lower back, initial encounter: Secondary | ICD-10-CM | POA: Insufficient documentation

## 2014-11-03 DIAGNOSIS — S66911A Strain of unspecified muscle, fascia and tendon at wrist and hand level, right hand, initial encounter: Secondary | ICD-10-CM | POA: Diagnosis not present

## 2014-11-03 DIAGNOSIS — W1839XA Other fall on same level, initial encounter: Secondary | ICD-10-CM | POA: Insufficient documentation

## 2014-11-03 DIAGNOSIS — Z862 Personal history of diseases of the blood and blood-forming organs and certain disorders involving the immune mechanism: Secondary | ICD-10-CM | POA: Diagnosis not present

## 2014-11-03 DIAGNOSIS — E119 Type 2 diabetes mellitus without complications: Secondary | ICD-10-CM | POA: Insufficient documentation

## 2014-11-03 DIAGNOSIS — Z79899 Other long term (current) drug therapy: Secondary | ICD-10-CM | POA: Insufficient documentation

## 2014-11-03 DIAGNOSIS — Y998 Other external cause status: Secondary | ICD-10-CM | POA: Diagnosis not present

## 2014-11-03 DIAGNOSIS — Z8709 Personal history of other diseases of the respiratory system: Secondary | ICD-10-CM | POA: Insufficient documentation

## 2014-11-03 DIAGNOSIS — Y9389 Activity, other specified: Secondary | ICD-10-CM | POA: Insufficient documentation

## 2014-11-03 DIAGNOSIS — Z91040 Latex allergy status: Secondary | ICD-10-CM | POA: Diagnosis not present

## 2014-11-03 DIAGNOSIS — E669 Obesity, unspecified: Secondary | ICD-10-CM | POA: Insufficient documentation

## 2014-11-03 DIAGNOSIS — Y9289 Other specified places as the place of occurrence of the external cause: Secondary | ICD-10-CM | POA: Diagnosis not present

## 2014-11-03 DIAGNOSIS — S6991XA Unspecified injury of right wrist, hand and finger(s), initial encounter: Secondary | ICD-10-CM | POA: Diagnosis present

## 2014-11-03 DIAGNOSIS — Z720 Tobacco use: Secondary | ICD-10-CM | POA: Diagnosis not present

## 2014-11-03 DIAGNOSIS — Z7982 Long term (current) use of aspirin: Secondary | ICD-10-CM | POA: Diagnosis not present

## 2014-11-03 DIAGNOSIS — G8929 Other chronic pain: Secondary | ICD-10-CM | POA: Insufficient documentation

## 2014-11-03 DIAGNOSIS — M549 Dorsalgia, unspecified: Secondary | ICD-10-CM

## 2014-11-03 MED ORDER — MORPHINE SULFATE 4 MG/ML IJ SOLN
6.0000 mg | Freq: Once | INTRAMUSCULAR | Status: AC
  Administered 2014-11-03: 6 mg via INTRAMUSCULAR
  Filled 2014-11-03: qty 2

## 2014-11-03 NOTE — Discharge Instructions (Signed)
As discussed follow up for continued or worsening symptoms Joint Sprain A sprain is a tear or stretch in the ligaments that hold a joint together. Severe sprains may need as long as 3-6 weeks of immobilization and/or exercises to heal completely. Sprained joints should be rested and protected. If not, they can become unstable and prone to re-injury. Proper treatment can reduce your pain, shorten the period of disability, and reduce the risk of repeated injuries. TREATMENT   Rest and elevate the injured joint to reduce pain and swelling.  Apply ice packs to the injury for 20-30 minutes every 2-3 hours for the next 2-3 days.  Keep the injury wrapped in a compression bandage or splint as long as the joint is painful or as instructed by your caregiver.  Do not use the injured joint until it is completely healed to prevent re-injury and chronic instability. Follow the instructions of your caregiver.  Long-term sprain management may require exercises and/or treatment by a physical therapist. Taping or special braces may help stabilize the joint until it is completely better. SEEK MEDICAL CARE IF:   You develop increased pain or swelling of the joint.  You develop increasing redness and warmth of the joint.  You develop a fever.  It becomes stiff.  Your hand or foot gets cold or numb. Document Released: 05/28/2004 Document Revised: 07/13/2011 Document Reviewed: 05/07/2008 [facility] Patient Information 2015 [place], [place]. This information is not intended to replace advice given to you by your health care provider. Make sure you discuss any questions you have with your health care provider.

## 2014-11-03 NOTE — ED Notes (Signed)
Pt from home reports that she fell 4 days ago after her leg "gave out" from chronic back pain. Pt fell to the ground and reports that she has more increased pain to lower back and R wrist. Pt denies LOC or other injury. Pt is ambulatory, A&O and in NAD

## 2014-11-03 NOTE — ED Provider Notes (Signed)
CSN: [id number]     Arrival date & time 11/03/14  1312 History  This chart was scribed for non-physician practitioner, Glendell Docker, NP working with Nat Christen, MD by Evelene Croon, ED Scribe. This patient was seen in room WTR6/WTR6 and the patient's care was started at 1:19 PM.    Chief Complaint  Patient presents with  . Fall  . Back Pain  . Wrist Pain   The history is provided by the patient. No language interpreter was used.     HPI Comments:  Anita Morgan is a 49 y.o. female with a PMHx of chronic low back pain secondary to herniated disc who presents to the Emergency Department s/p fall 4 days ago complaining of constant progressively worsening right wrist pain following the incident.  Pt states her legs gave out, which has happened in the past due to her back pain and she fell and landed on her right wrist. She denies a h/o injury to that wrist. No bowel/ bladder incontinence or LOC noted. No alleviating factors noted. Pt has no other complaints/injuries at this time. Hs taken her percocet without relief   Past Medical History  Diagnosis Date  . Diabetes mellitus without complication   . Sickle cell anemia   . Bronchitis   . Ruptured lumbar disc   . PONV (postoperative nausea and vomiting)   . DVT (deep vein thrombosis) in pregnancy 2007    right leg  . Chronic low back pain   . Obesity   . Hypertension    Past Surgical History  Procedure Laterality Date  . Cesarean section     Family History  Problem Relation Age of Onset  . Diabetes Mother    History  Substance Use Topics  . Smoking status: Current Every Day Smoker -- 0.75 packs/day for 16 years    Types: Cigarettes  . Smokeless tobacco: Never Used  . Alcohol Use: No   OB History    Gravida Para Term Preterm AB TAB SAB Ectopic Multiple Living   5 4 3 1 1  1         Review of Systems  Constitutional: Negative for fever.  Musculoskeletal: Positive for myalgias, back pain and arthralgias. Negative for neck  pain.  All other systems reviewed and are negative.     Allergies  Bee venom; Influenza vaccines; Other; Penicillins; and Latex  Home Medications   Prior to Admission medications   Medication Sig Start Date End Date Taking? Authorizing Provider  aspirin EC 81 MG tablet Take 81 mg by mouth daily.    Historical Provider, MD  azithromycin (ZITHROMAX Z-PAK) 250 MG tablet 2 po day one, then 1 daily x 4 days. START ONLY IF SYMPTOMS WORSEN 04/26/14   Mercedes Camprubi-Soms, PA-C  Canagliflozin (INVOKANA) 300 MG TABS Take 300 mg by mouth every morning.     Historical Provider, MD  Chlorphen-Phenyleph-ASA (ALKA-SELTZER PLUS COLD PO) Take 1 tablet by mouth every 6 (six) hours as needed (allergies.).    Historical Provider, MD  cyclobenzaprine (FLEXERIL) 10 MG tablet Take 1 tablet (10 mg total) by mouth 3 (three) times daily as needed for muscle spasms. 12/08/13   Evelina Bucy, MD  diphenhydrAMINE (BENADRYL) 25 mg capsule Take 1 capsule (25 mg total) by mouth every 6 (six) hours as needed for itching. 02/15/13   Teressa Lower, MD  diphenhydrAMINE (BENADRYL) 25 MG tablet Take 1 tablet (25 mg total) by mouth every 4 (four) hours as needed (headache). 04/26/14   Mercedes Camprubi-Soms, PA-C  EPINEPHrine (EPIPEN) 0.3 mg/0.3 mL SOAJ injection Inject 0.3 mLs (0.3 mg total) into the muscle as needed. 02/15/13   Teressa Lower, MD  ferrous sulfate 325 (65 FE) MG tablet Take 325 mg by mouth daily with breakfast.    Historical Provider, MD  fluticasone (FLONASE) 50 MCG/ACT nasal spray Place 2 sprays into both nostrils daily. 04/26/14   Mercedes Camprubi-Soms, PA-C  ibuprofen (ADVIL,MOTRIN) 800 MG tablet Take 800 mg by mouth every 8 (eight) hours as needed for moderate pain.     Historical Provider, MD  insulin detemir (LEVEMIR) 100 UNIT/ML injection Inject 45 Units into the skin every morning.    Historical Provider, MD  losartan (COZAAR) 25 MG tablet Take 1 tablet (25 mg total) by mouth daily. 04/26/14   Mercedes  Camprubi-Soms, PA-C  metaxalone (SKELAXIN) 800 MG tablet Take 800 mg by mouth 4 (four) times daily as needed for muscle spasms.    Historical Provider, MD  metoCLOPramide (REGLAN) 10 MG tablet Take 1 tablet (10 mg total) by mouth every 6 (six) hours as needed for nausea (nausea/headache). 04/26/14   Mercedes Camprubi-Soms, PA-C  naproxen (NAPROSYN) 500 MG tablet Take 1 tablet (500 mg total) by mouth 2 (two) times daily as needed for mild pain, moderate pain or headache (TAKE WITH MEALS.). 04/26/14   Mercedes Camprubi-Soms, PA-C  oxyCODONE-acetaminophen (PERCOCET) 5-325 MG per tablet Take 1-2 tablets by mouth every 6 (six) hours as needed for severe pain. 04/26/14   Mercedes Camprubi-Soms, PA-C  oxyCODONE-acetaminophen (PERCOCET/ROXICET) 5-325 MG per tablet Take 1 tablet by mouth every 6 (six) hours as needed for severe pain. Patient not taking: Reported on 04/26/2014 12/08/13   Evelina Bucy, MD  potassium chloride SA (K-DUR,KLOR-CON) 20 MEQ tablet Take 1 tablet (20 mEq total) by mouth daily. 04/26/14   Mercedes Camprubi-Soms, PA-C  sulfamethoxazole-trimethoprim (BACTRIM DS) 800-160 MG per tablet Take 1 tablet by mouth 2 (two) times daily. Patient not taking: Reported on 04/26/2014 12/08/13   Evelina Bucy, MD   There were no vitals taken for this visit. Physical Exam  Constitutional: She is oriented to person, place, and time. She appears well-developed and well-nourished. No distress.  HENT:  Head: Normocephalic and atraumatic.  Eyes: Conjunctivae are normal.  Neck: Normal range of motion.  Cardiovascular: Normal rate.   Pulmonary/Chest: Effort normal.  Musculoskeletal: Normal range of motion.  Tenderness in the medial aspect of the right wrist. No gross deformity or swelling noted  Neurological: She is alert and oriented to person, place, and time. She exhibits normal muscle tone. Coordination normal.  Skin: Skin is warm and dry.  Nursing note and vitals reviewed.   ED Course  Procedures    DIAGNOSTIC STUDIES:  Oxygen Saturation is 97% on RA, normal by my interpretation.    COORDINATION OF CARE:  1:26 PM Will order imaging studies. Advised pt to keep extremity immobilized upon discharge. Discussed treatment plan with pt at bedside and pt agreed to plan.  Labs Review Labs Reviewed - No data to display  Imaging Review No results found.   EKG Interpretation None      MDM   Final diagnoses:  Wrist strain, right, initial encounter  Chronic back pain    No acute bony abnormality noted. Pt given splint for comfort. She has percocet at home. She doesn't think this is a crisis  I personally performed the services described in this documentation, which was scribed in my presence. The recorded information has been reviewed and is accurate.    Glendell Docker,  NP 11/03/14 1347  Nat Christen, MD 11/03/14 [phone]

## 2015-01-23 IMAGING — CR DG FOOT COMPLETE 3+V*L*
3 series · 3 of 3 positions shown · non-contrast
Comparison: None.

CLINICAL DATA: Heel pain

EXAM:
LEFT FOOT - COMPLETE 3+ VIEW

[x foot ap left]
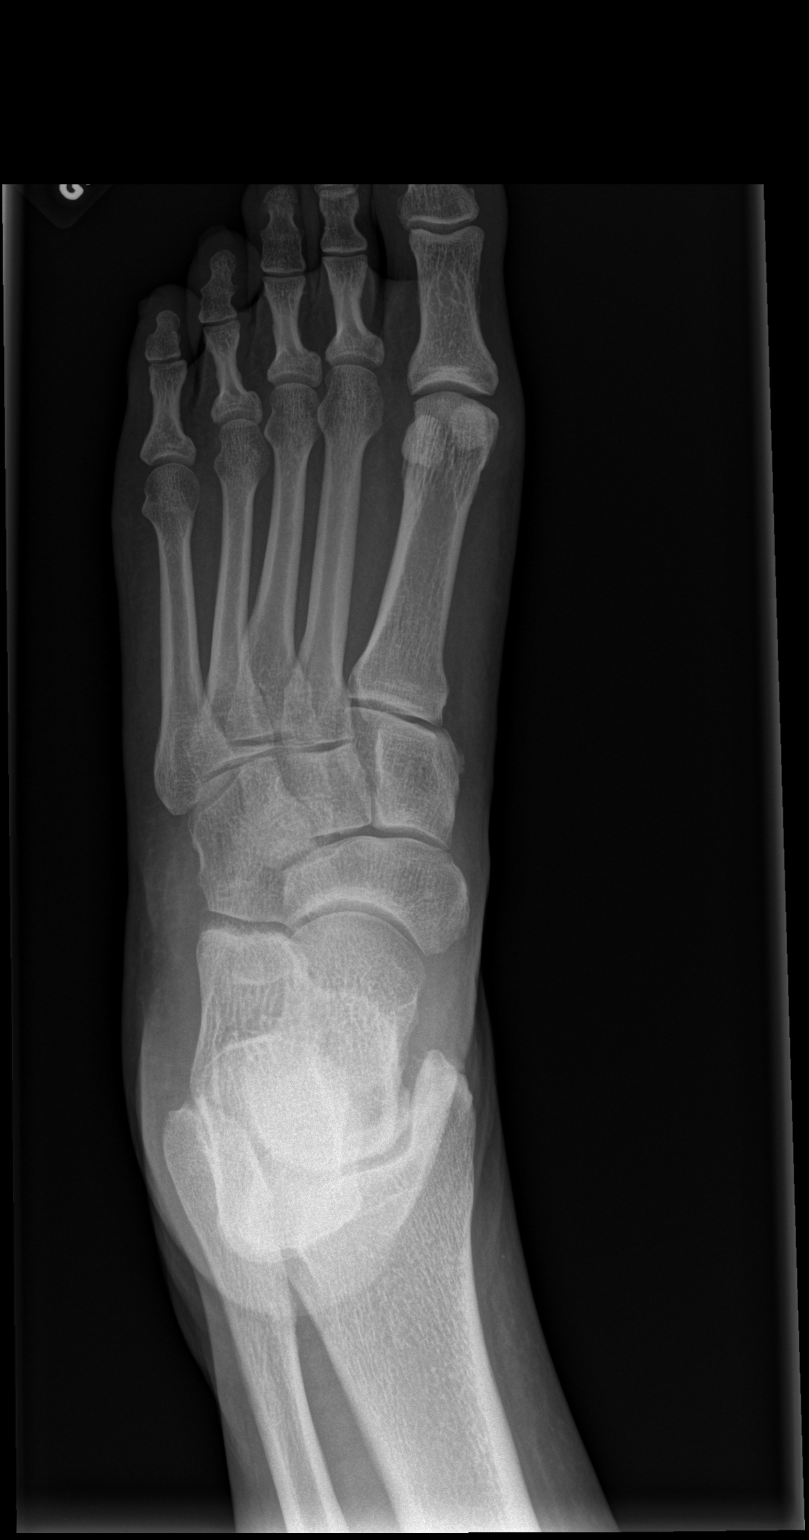

[x foot obl left]
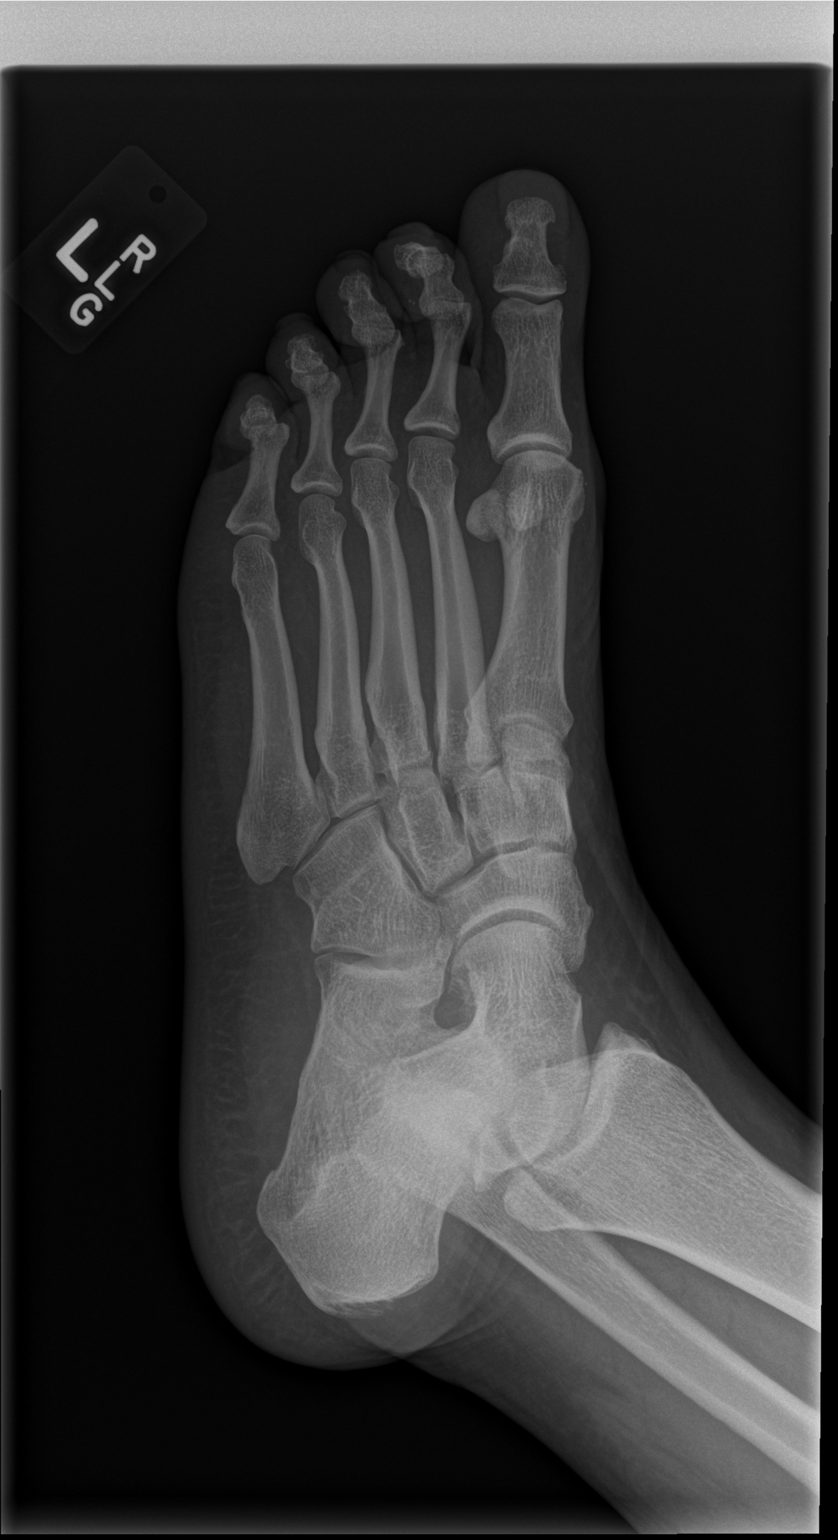

[x foot lat left]
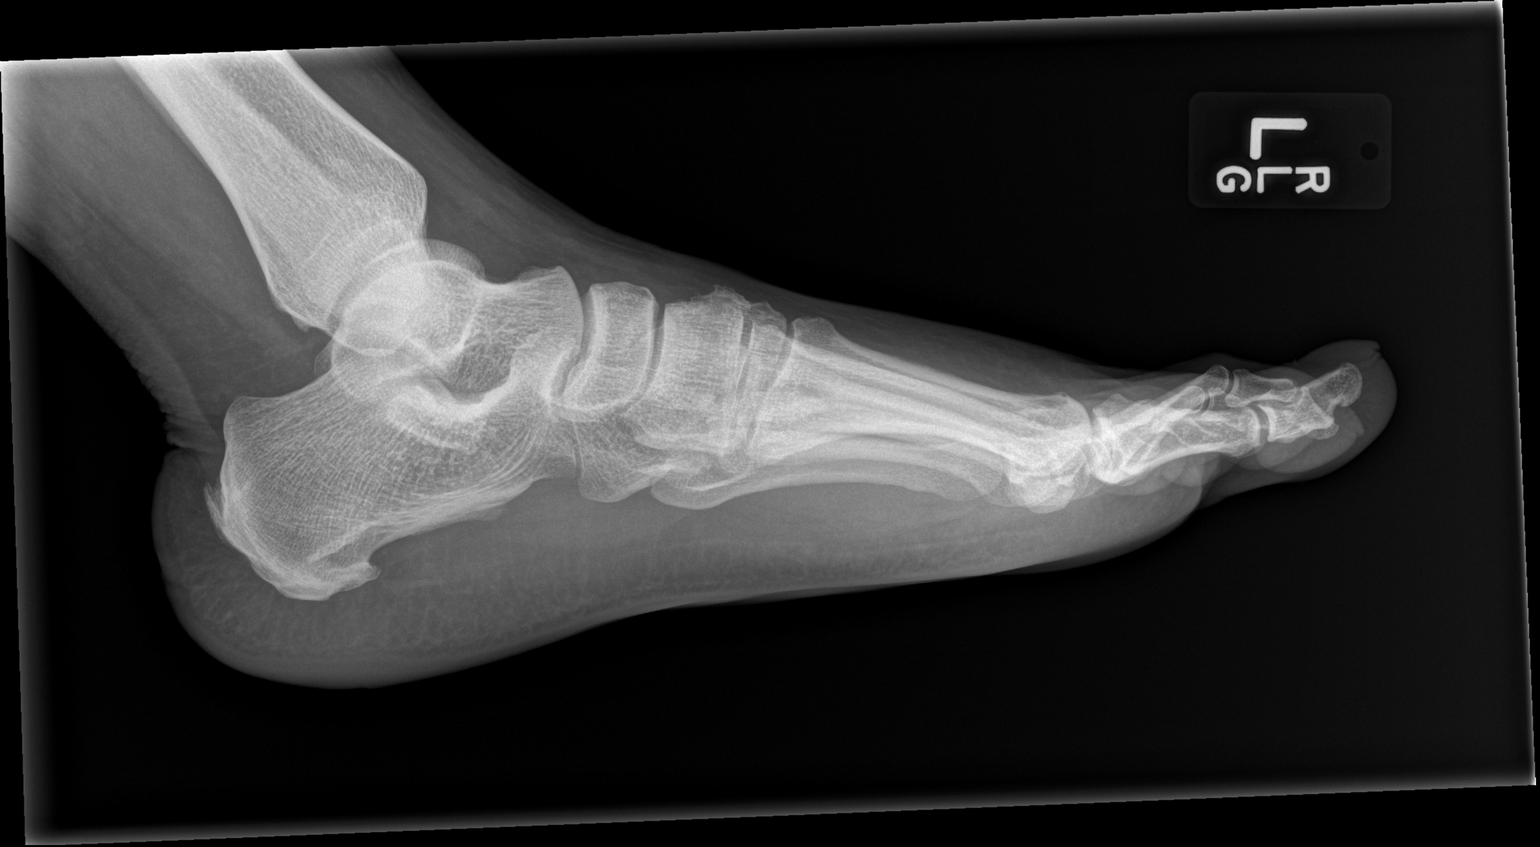

[3 of 3 positions shown; findings below may reference images not displayed]

FINDINGS: There is no evidence of fracture or dislocation. There is no
evidence of arthropathy or other focal bone abnormality. Soft
tissues are unremarkable.
IMPRESSION: Negative.

## 2015-12-19 IMAGING — CR DG WRIST COMPLETE 3+V*R*
4 series · 4 of 4 positions shown · non-contrast
Comparison: None.

CLINICAL DATA: Right wrist pain since a fall at home 5 days ago.

EXAM:
RIGHT WRIST - COMPLETE 3+ VIEW

[x wrist pa right]
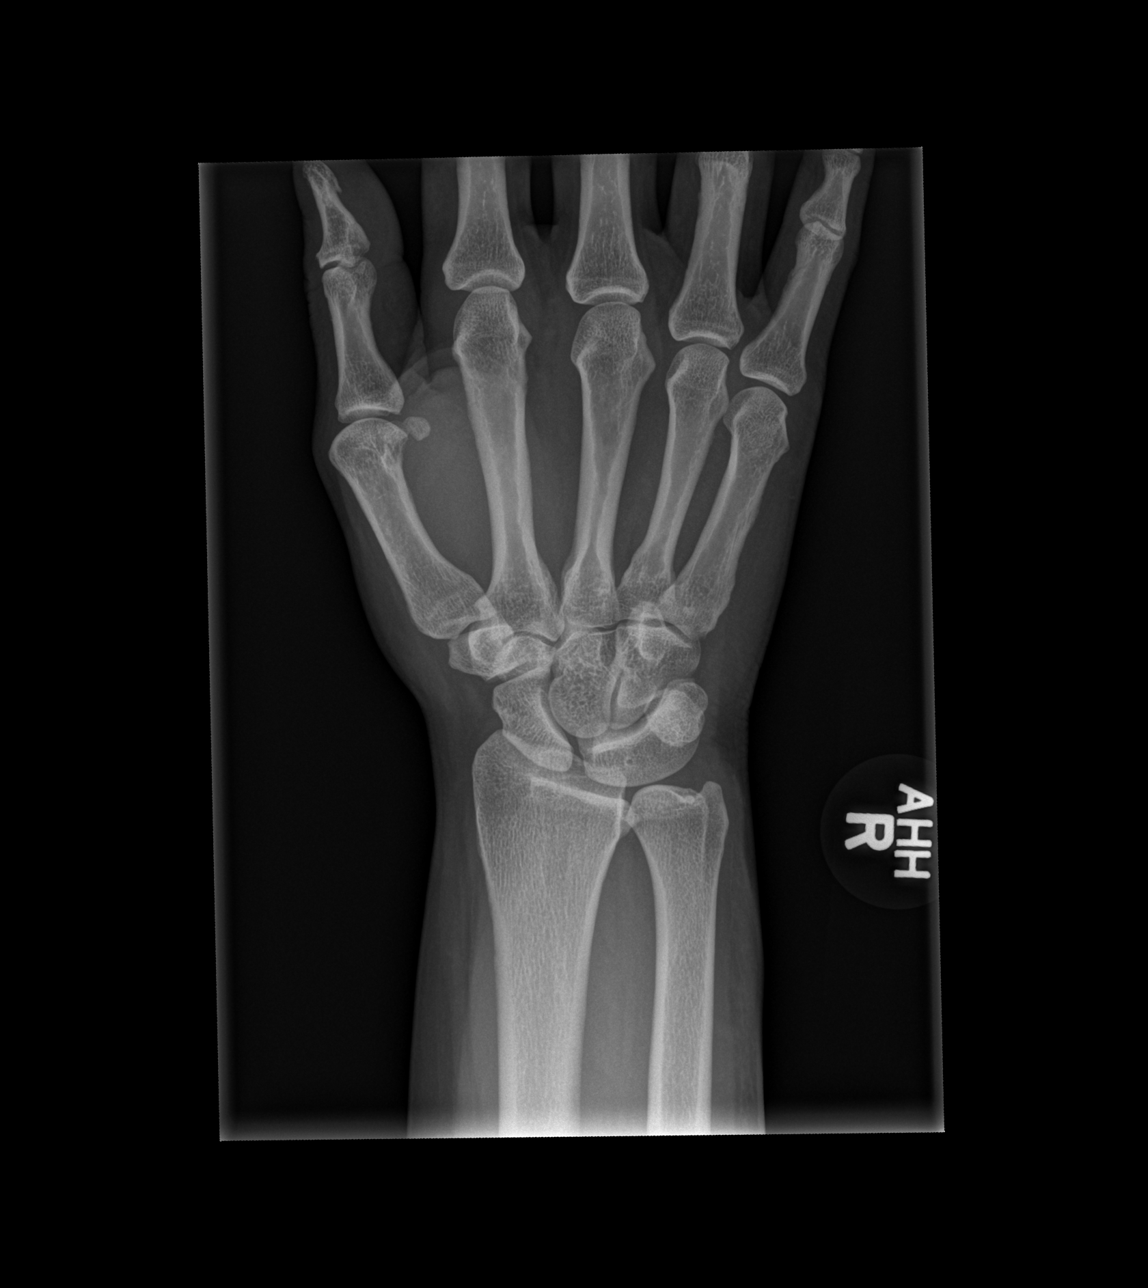

[x wrist obl right]
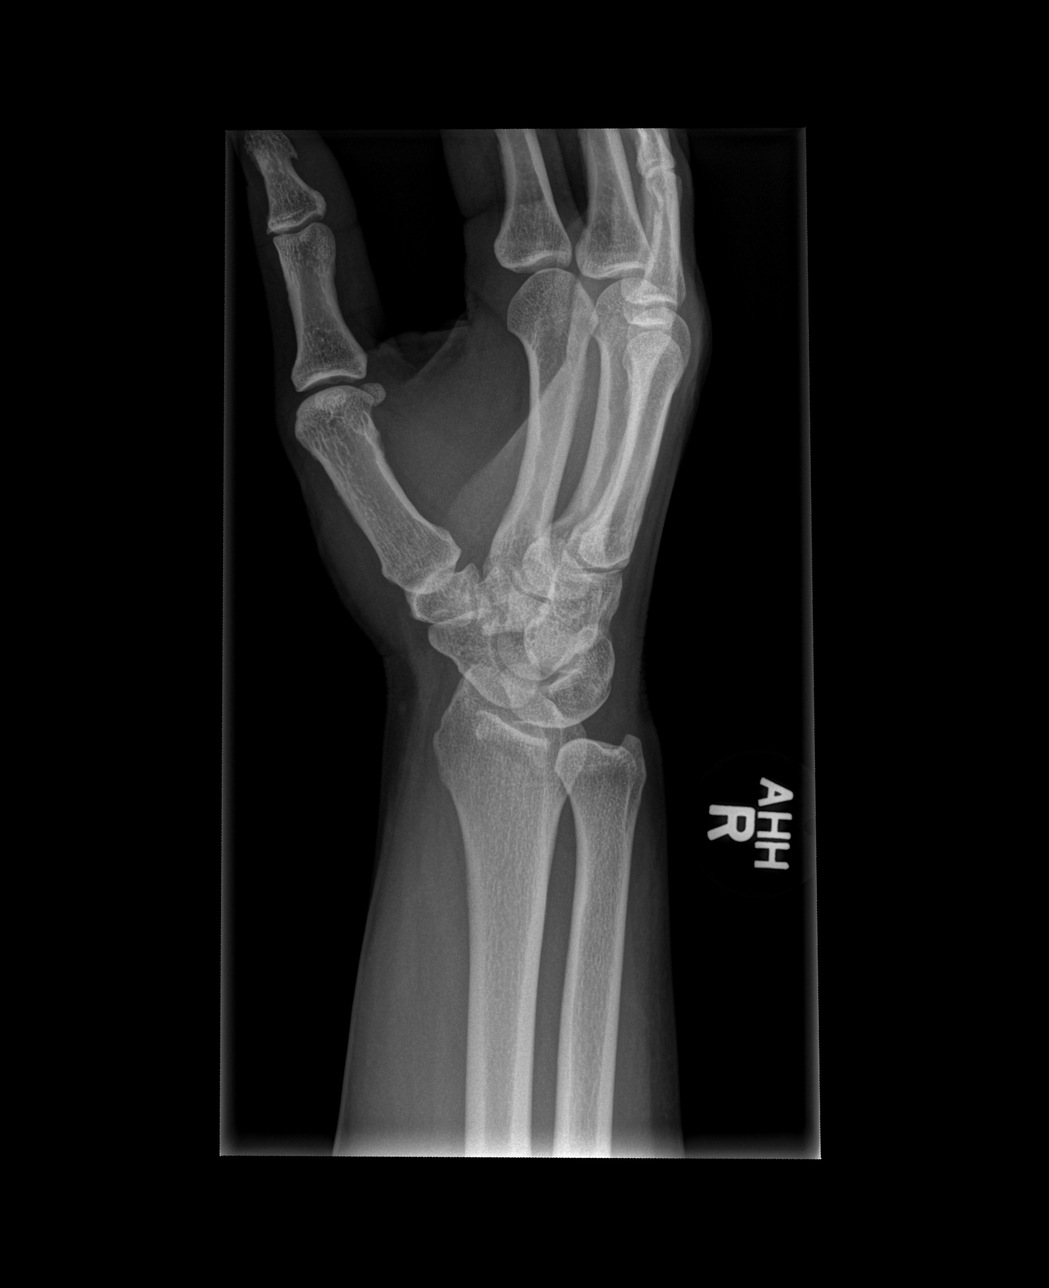

[x wrist lat right]
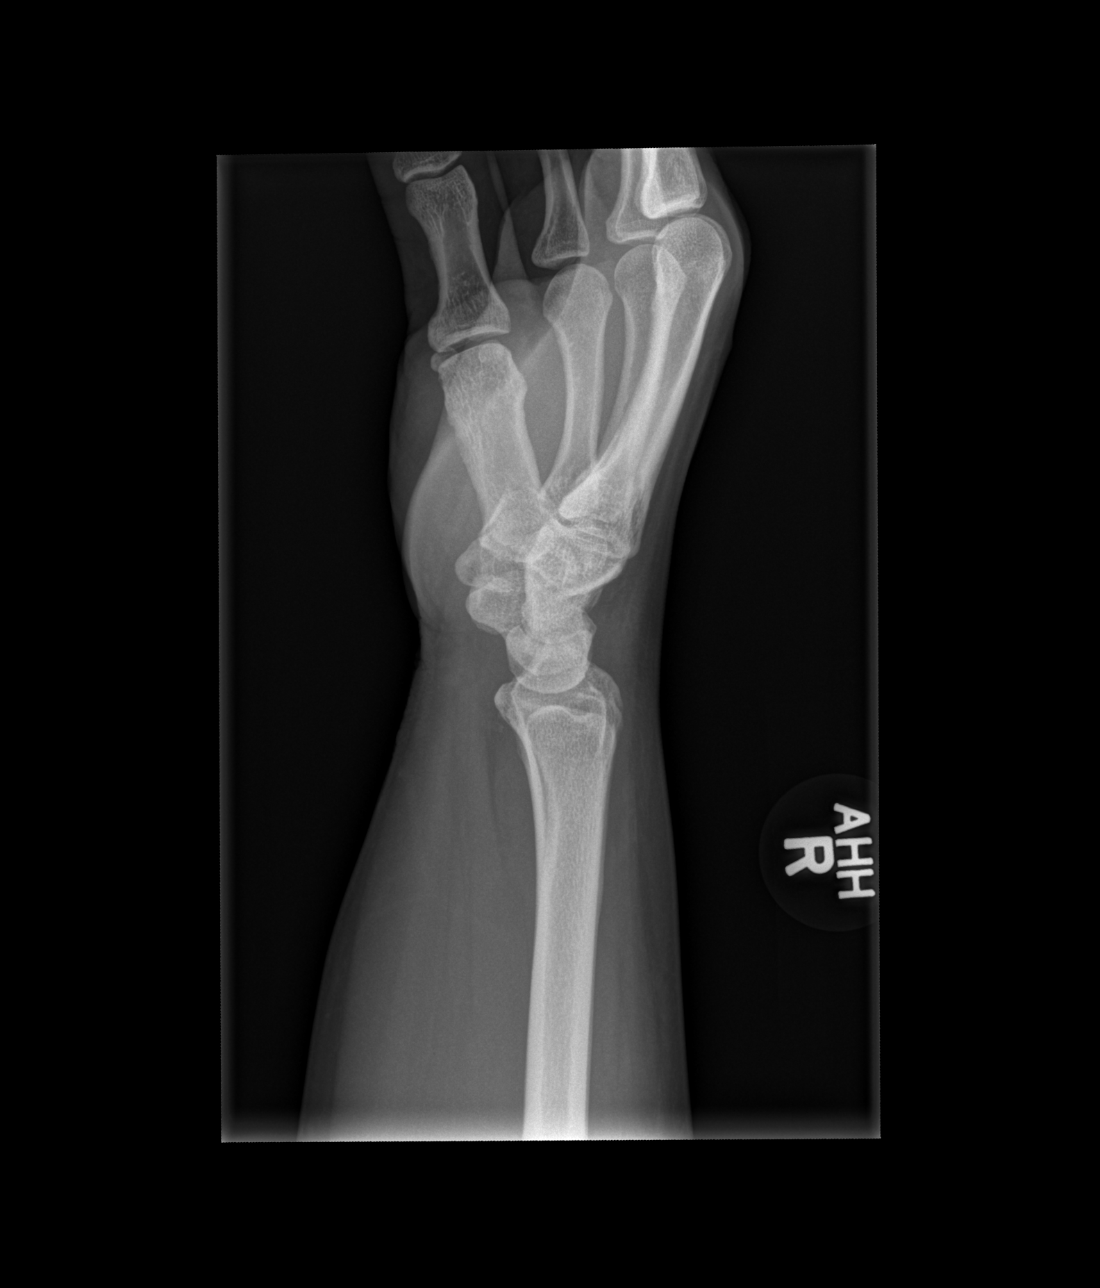

[x wrist navicular view right]
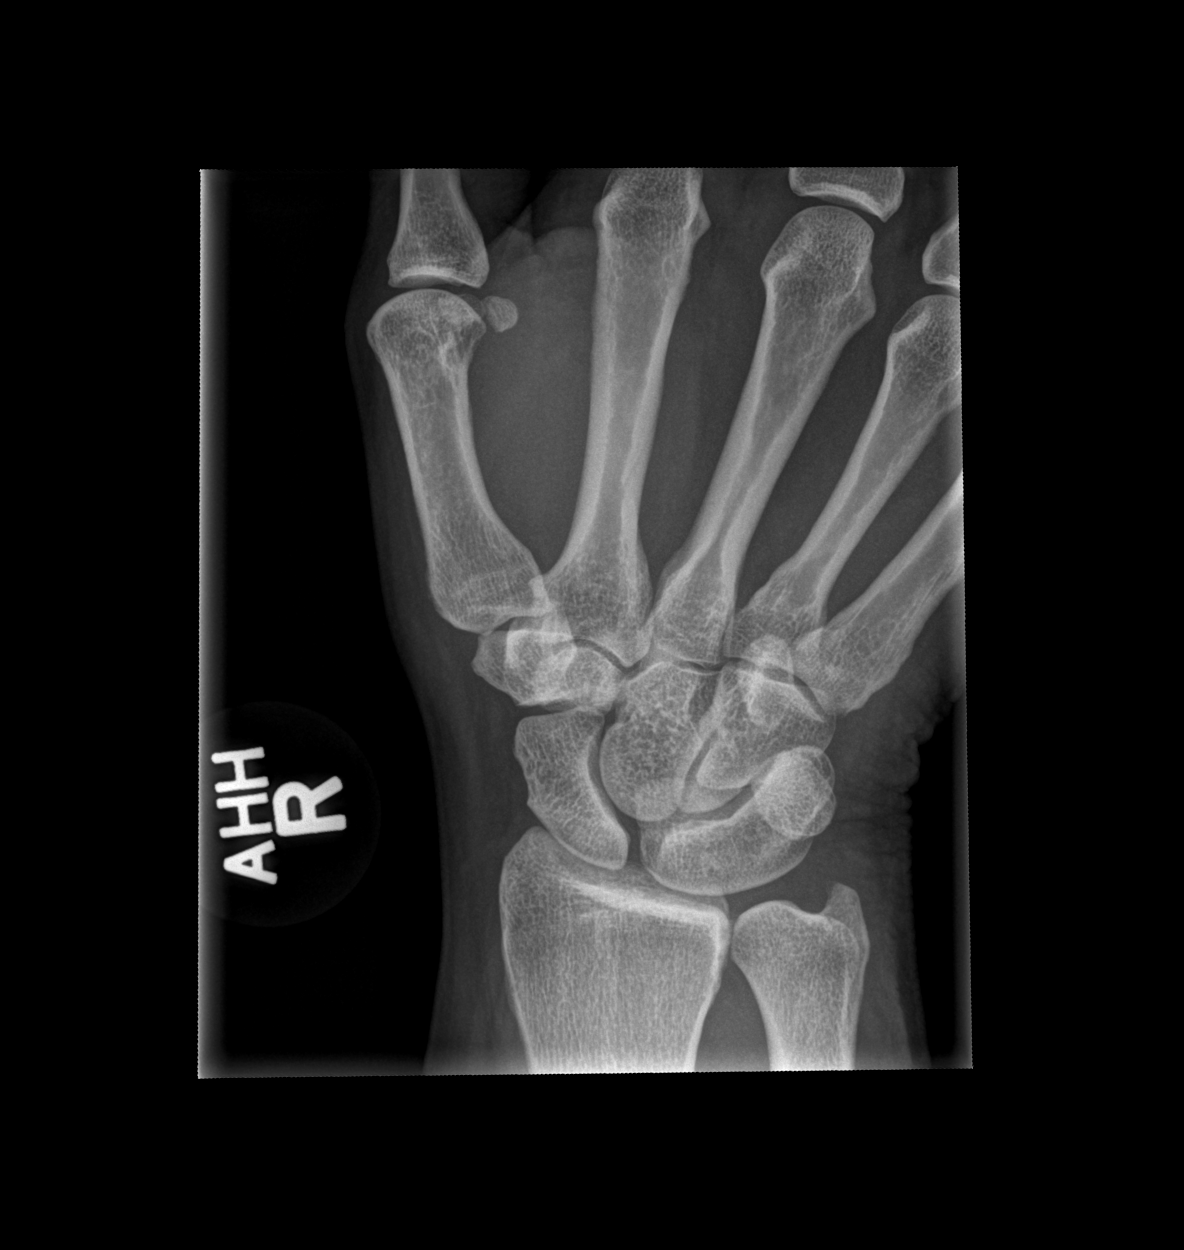

[4 of 4 positions shown; findings below may reference images not displayed]

FINDINGS: There is no fracture or dislocation. Congenital fusion of the
triquetrum and lunate. No appreciable arthritis.
IMPRESSION: No acute abnormality.
